# Patient Record
Sex: Male | Born: 1935 | Race: White | Hispanic: No | Marital: Married | Smoking: Never smoker
Health system: Southern US, Community
[De-identification: ages and names within clinical notes are randomized; demographics above are authoritative.]

## PROBLEM LIST (undated history)

## (undated) DIAGNOSIS — I10 Essential (primary) hypertension: Secondary | ICD-10-CM

## (undated) DIAGNOSIS — F0390 Unspecified dementia without behavioral disturbance: Secondary | ICD-10-CM

## (undated) DIAGNOSIS — C801 Malignant (primary) neoplasm, unspecified: Secondary | ICD-10-CM

## (undated) DIAGNOSIS — E7800 Pure hypercholesterolemia, unspecified: Secondary | ICD-10-CM

## (undated) DIAGNOSIS — D649 Anemia, unspecified: Secondary | ICD-10-CM

---

## 2001-03-24 ENCOUNTER — Encounter: Admission: RE | Admit: 2001-03-24 | Discharge: 2001-03-24 | Payer: Self-pay | Admitting: Internal Medicine

## 2001-03-24 ENCOUNTER — Encounter: Payer: Self-pay | Admitting: Internal Medicine

## 2001-04-24 ENCOUNTER — Encounter (INDEPENDENT_AMBULATORY_CARE_PROVIDER_SITE_OTHER): Payer: Self-pay | Admitting: Specialist

## 2001-04-24 ENCOUNTER — Other Ambulatory Visit: Admission: RE | Admit: 2001-04-24 | Discharge: 2001-04-24 | Payer: Self-pay | Admitting: Urology

## 2001-09-09 ENCOUNTER — Inpatient Hospital Stay (HOSPITAL_COMMUNITY): Admission: EM | Admit: 2001-09-09 | Discharge: 2001-09-10 | Payer: Self-pay | Admitting: Emergency Medicine

## 2001-09-19 ENCOUNTER — Ambulatory Visit (HOSPITAL_COMMUNITY): Admission: RE | Admit: 2001-09-19 | Discharge: 2001-09-19 | Payer: Self-pay | Admitting: Internal Medicine

## 2001-09-28 ENCOUNTER — Ambulatory Visit (HOSPITAL_COMMUNITY): Admission: RE | Admit: 2001-09-28 | Discharge: 2001-09-28 | Payer: Self-pay | Admitting: Cardiology

## 2002-08-23 ENCOUNTER — Ambulatory Visit: Admission: RE | Admit: 2002-08-23 | Discharge: 2002-11-21 | Payer: Self-pay | Admitting: Radiation Oncology

## 2002-12-10 ENCOUNTER — Ambulatory Visit: Admission: RE | Admit: 2002-12-10 | Discharge: 2002-12-10 | Payer: Self-pay | Admitting: Radiation Oncology

## 2003-06-17 ENCOUNTER — Ambulatory Visit: Admission: RE | Admit: 2003-06-17 | Discharge: 2003-06-17 | Payer: Self-pay | Admitting: Radiation Oncology

## 2003-12-10 ENCOUNTER — Ambulatory Visit: Admission: RE | Admit: 2003-12-10 | Discharge: 2003-12-10 | Payer: Self-pay | Admitting: Radiation Oncology

## 2007-07-13 ENCOUNTER — Emergency Department (HOSPITAL_COMMUNITY): Admission: EM | Admit: 2007-07-13 | Discharge: 2007-07-13 | Payer: Self-pay | Admitting: Emergency Medicine

## 2008-08-05 ENCOUNTER — Encounter: Admission: RE | Admit: 2008-08-05 | Discharge: 2008-08-05 | Payer: Self-pay | Admitting: Internal Medicine

## 2009-10-21 ENCOUNTER — Encounter: Admission: RE | Admit: 2009-10-21 | Discharge: 2009-10-21 | Payer: Self-pay | Admitting: Neurological Surgery

## 2010-12-04 NOTE — Discharge Summary (Signed)
[facility]  Patient:    Nathan Glenn, Nathan Glenn Visit Number: [id number] MRN: [id number]          Service Type: CAT Location: [facility] 2864 01 Attending Physician:  Norman Clay Dictated by:   Pearletha Furl Jacky Kindle, M.D. Admit Date:  09/28/2001 Discharge Date: 09/28/2001                             Discharge Summary  DISCHARGE DIAGNOSIS:  Left arm and chest pain.  Myocardial infarction ruled out.  HISTORY OF PRESENT ILLNESS:  Mr. Nathan Glenn is a 77 year old gentleman without past medical history presenting with 24 hours of left arm pain and questionable chest symptoms that waxed and waned, and seemingly were responsive to nitroglycerin in the emergency room.  Upon my evaluation, actually, symptoms had recurred and were quite atypical, but given emergency room physician suspicions and administration of nitroglycerin, he was admitted for 24 hours to rule out myocardial infarction.  For details, see the dictated summary on the chart.  He has had no prior cardiac history.  LABORATORY DATA:  EKG was normal.  CBC showed a hemoglobin of 13.5, hematocrit 38.5, white blood cell count 6.8, platelet count 208,000.  Chemistries showed sodium of 140, potassium 4.3, chloride 108, CO2 28, BUN 12, creatinine 1.0, calcium 9.3, glucose 97.  CKs were 53 and 49, respectively.  MBs were 0.7 and 0.9, respectively.  Third CK was 47, with an MB of 0.5.  Troponin-I was 0.01, with no indication of injury.  Repeat EKG was unremarkable.  Chest x-ray was done supposedly by the emergency room, and not present for review on the chart.  HOSPITAL COURSE:  The patient was admitted, observed for 24 hours.  Enzymes were unremarkable.  EKG was without change.  He had no further symptomatology. He was discharged in stable condition to be followed up as an outpatient by Dr. Waynard Edwards for further assessment regarding the need for any further cardiac testing or further workup.  CONDITION  ON DISCHARGE:  Improved and stable.  PROGNOSIS:  Good. Dictated by:   Pearletha Furl Jacky Kindle, M.D. Attending Physician:  Norman Clay DD:  10/27/01 TD:  10/28/01 Job: [id number]

## 2010-12-04 NOTE — H&P (Signed)
[facility]  Patient:    Nathan Glenn, Nathan Glenn Visit Number: [id number] MRN: [id number]          Service Type: MED Location: 3W 4854 02 Attending Physician:  Beatris Ship Dictated by:   Pearletha Furl Jacky Kindle, M.D. Admit Date:  09/09/2001                           History and Physical  CHIEF COMPLAINT:  Rule out MI/left arm pain.  HISTORY OF PRESENT ILLNESS:  Mr. Portlock is a 77 year old gentleman without significant past medical history, presenting with waxing and waning left arm symptoms.  He has no prior cardiac history but does have a history of cervical spine disease, status post cervical spine surgery several years ago.  He has had some chronic cervical spine pain but has never had any chest or arm symptoms.  He relates yesterday morning the onset of a fairly significant waxing and waning left arm pain which had lasted all yesterday and persisted through to this morning hours, resulting in presentation to the emergency room.  He has had no chest pain, shortness of breath, nausea, or vomiting, and has had no shortness of breath, cough, or sputum.  The pain is clearly nonpositional, related to the shoulder or neck.  Emergency room physician administered sublingual nitroglycerin with near total resolution of all symptoms, resulting in this admission for possible angina equivalent and rule out MI.  The patient has had no back symptoms and, at this time, is essentially pain-free.  ALLERGIES:  No known drug allergies.  MEDICATIONS:  Doxycycline 100 mg daily.  PAST MEDICAL HISTORY: 1. Rosacea. 2. Cervical spondylosis.  PAST SURGICAL HISTORY: 1. Appendectomy. 2. Cervical disk 1989. 3. Prostate biopsy and abscess.  SOCIAL HISTORY:  The patient is retired.  Does not smoke or drink.  He is married and has one child.  PHYSICAL EXAMINATION:  VITAL SIGNS:  Blood pressure 160/80, pulse 70, respiratory rate 18, temperature 96.9.  GENERAL:   The patient is pleasant.  In no distress.  HEENT:  Normocephalic, anicteric.  Good facial symmetry.  Oropharynx benign.  NECK:  Supple.  No JVD.  No bruits.  LUNGS:  Clear.  CARDIOVASCULAR:  Regular rate and rhythm.  No murmur.  CHEST:  Chest wall negative.  ABDOMEN:  Soft and nontender.  No hepatosplenomegaly.  Bowel sounds normal.  BACK:  Without CVA or spinal tenderness.  EXTREMITIES:  Trace edema.  Calves soft and nontender.  Intact pulses.  Joints normal.  Warm distally.  No mottling.  NEUROLOGIC:  Nonlateralizing.  Higher cortical functioning is intact. Nonfocal.  GU, RECTAL:  Not done.  LABORATORY DATA:  EKG:  Normal sinus rhythm.  No abnormalities.  ASSESSMENT:  Left arm symptoms:  Response to nitroglycerin suggestive of anginal equivalent, but clinical picture certainly consistent with cervical spine disease and radiculopathy as well.  Given the protracted nature of symptoms and normal ______ of the EKG, I think cardiac etiology of low likelihood; but, again, given nitroglycerin response prior to my arrival, cardiac etiology, albeit low, needs to be considered.  PLAN:  Patient to be admitted for serial enzymes, topical nitrates, serial EKGs.  Further pending his course.  We will also initiate empiric nonsteroidals. Dictated by:   Pearletha Furl Jacky Kindle, M.D. Attending Physician:  Beatris Ship DD:  09/09/01 TD:  09/10/01 Job: [id number]

## 2010-12-04 NOTE — Cardiovascular Report (Signed)
[place]  Patient:    Nathan Glenn, Nathan Glenn Visit Number: [id number] MRN: [id number]          Service Type: CAT Location: [facility] 2864 01 Attending Physician:  Norman Clay Dictated by:   Darden Palmer., M.D. Proc. Date: 09/28/01 Admit Date:  09/28/2001 Discharge Date: 09/28/2001   CC:         Cardiac Catheterization Lab  [facility]   Cardiac Catheterization  HISTORY:  A 77 year old white male who has a previous history of some atypical left arm pain as well as neck pain.  There is the possibility he may need to have cervical disk disease done.  He tolerated the procedure well without complications and following the procedure, he had to go to hemostasis present.  Intracoronary nitroglycerin was given down the right coronary artery because of possible proximal spasm which resolved with IC nitroglycerin.  He tolerated the procedure well.  HEMODYNAMIC DATA: 1. Aorta post contrast 97/52. 2. LV post contrast 97/0-7.  ANGIOGRAPHIC DATA:  LEFT VENTRICULOGRAM:  Performed in the 30-degree Nathan Glenn projection.  The aortic valve was normal.  The mitral valve was normal.  The left ventricle appears normal in size.  The estimated ejection fraction is 60-65%.  CORONARY ARTERIOGRAPHY:  Coronary arteries arise and distribute normally. There is calcification present involving the proximal left coronary system as well as the right coronary artery. 1. The left main coronary artery has a mild 20-30% ostial narrowing.  2. The left anterior descending has mild 20-30% proximal irregularity.  The    first diagonal branch has a segmental 80% ostial stenosis noted.  3. The circumflex marginal branch has a large branching marginal system.  A    small intermedius branch arises.  The inferior branch of the first marginal    branch has a segmental 50-70% ostial narrowing noted.  The second marginal    artery is free of  disease.  4. The right coronary artery contains no significant proximal disease after IC    nitroglycerin.  There is a segmental area of distal disease estimated at    70-80% in the mid to distal portion involving the origin of the posterior    descending artery.  IMPRESSION: 1. Coronary artery disease, predominantly involving the diagonal branch of the    left anterior descending artery and inferior branch of the first marginal    branch and the mid to distal right coronary artery which is moderate to    moderately severe. 2. Normal left ventricular function.  RECOMMENDATIONS:  Continue medical therapy at this time.  Add beta blockers to therapy, intensive lipid treatment.  Search for other causes of arm pain. Dictated by:   Darden Palmer., M.D. Attending Physician:  Norman Clay DD:  09/28/01 TD:  09/29/01 Job: [id number]

## 2011-07-09 ENCOUNTER — Other Ambulatory Visit: Payer: Self-pay | Admitting: Cardiology

## 2011-07-26 ENCOUNTER — Other Ambulatory Visit: Payer: Self-pay | Admitting: Neurological Surgery

## 2011-07-26 DIAGNOSIS — M542 Cervicalgia: Secondary | ICD-10-CM

## 2011-08-10 ENCOUNTER — Ambulatory Visit
Admission: RE | Admit: 2011-08-10 | Discharge: 2011-08-10 | Disposition: A | Discharge status: Home / Self Care | Payer: Medicare Other | Source: Ambulatory Visit | Attending: Neurological Surgery | Admitting: Neurological Surgery

## 2011-08-10 DIAGNOSIS — M542 Cervicalgia: Secondary | ICD-10-CM

## 2013-10-09 ENCOUNTER — Emergency Department (HOSPITAL_COMMUNITY): Payer: Medicare Other

## 2013-10-09 ENCOUNTER — Emergency Department (HOSPITAL_COMMUNITY)
Admission: EM | Admit: 2013-10-09 | Discharge: 2013-10-09 | Disposition: A | Discharge status: Home / Self Care | Payer: Medicare Other | Attending: Emergency Medicine | Admitting: Emergency Medicine

## 2013-10-09 ENCOUNTER — Encounter (HOSPITAL_COMMUNITY): Payer: Self-pay | Admitting: Emergency Medicine

## 2013-10-09 DIAGNOSIS — F0280 Dementia in other diseases classified elsewhere without behavioral disturbance: Secondary | ICD-10-CM | POA: Insufficient documentation

## 2013-10-09 DIAGNOSIS — D649 Anemia, unspecified: Secondary | ICD-10-CM

## 2013-10-09 DIAGNOSIS — Z79899 Other long term (current) drug therapy: Secondary | ICD-10-CM | POA: Insufficient documentation

## 2013-10-09 DIAGNOSIS — G309 Alzheimer's disease, unspecified: Secondary | ICD-10-CM | POA: Insufficient documentation

## 2013-10-09 DIAGNOSIS — R531 Weakness: Secondary | ICD-10-CM

## 2013-10-09 DIAGNOSIS — R5383 Other fatigue: Principal | ICD-10-CM

## 2013-10-09 DIAGNOSIS — R5381 Other malaise: Secondary | ICD-10-CM | POA: Insufficient documentation

## 2013-10-09 HISTORY — DX: Unspecified dementia, unspecified severity, without behavioral disturbance, psychotic disturbance, mood disturbance, and anxiety: F03.90

## 2013-10-09 LAB — CBC WITH DIFFERENTIAL/PLATELET
BASOS ABS: 0.1 10*3/uL (ref 0.0–0.1)
BASOS PCT: 0 % (ref 0–1)
EOS ABS: 0.2 10*3/uL (ref 0.0–0.7)
Eosinophils Relative: 2 % (ref 0–5)
HCT: 26.5 % — ABNORMAL LOW (ref 39.0–52.0)
Hemoglobin: 8.3 g/dL — ABNORMAL LOW (ref 13.0–17.0)
Lymphocytes Relative: 13 % (ref 12–46)
Lymphs Abs: 1.8 10*3/uL (ref 0.7–4.0)
MCH: 26.1 pg (ref 26.0–34.0)
MCHC: 31.3 g/dL (ref 30.0–36.0)
MCV: 83.3 fL (ref 78.0–100.0)
MONOS PCT: 12 % (ref 3–12)
Monocytes Absolute: 1.6 10*3/uL — ABNORMAL HIGH (ref 0.1–1.0)
NEUTROS ABS: 10.3 10*3/uL — AB (ref 1.7–7.7)
NEUTROS PCT: 74 % (ref 43–77)
PLATELETS: 532 10*3/uL — AB (ref 150–400)
RBC: 3.18 MIL/uL — ABNORMAL LOW (ref 4.22–5.81)
RDW: 13.4 % (ref 11.5–15.5)
WBC: 14.1 10*3/uL — ABNORMAL HIGH (ref 4.0–10.5)

## 2013-10-09 LAB — URINALYSIS, ROUTINE W REFLEX MICROSCOPIC
Bilirubin Urine: NEGATIVE
GLUCOSE, UA: NEGATIVE mg/dL
HGB URINE DIPSTICK: NEGATIVE
Ketones, ur: NEGATIVE mg/dL
LEUKOCYTES UA: NEGATIVE
Nitrite: NEGATIVE
PH: 7 (ref 5.0–8.0)
PROTEIN: NEGATIVE mg/dL
SPECIFIC GRAVITY, URINE: 1.016 (ref 1.005–1.030)
Urobilinogen, UA: 1 mg/dL (ref 0.0–1.0)

## 2013-10-09 LAB — COMPREHENSIVE METABOLIC PANEL
ALT: 15 U/L (ref 0–53)
AST: 19 U/L (ref 0–37)
Albumin: 2.7 g/dL — ABNORMAL LOW (ref 3.5–5.2)
Alkaline Phosphatase: 154 U/L — ABNORMAL HIGH (ref 39–117)
BILIRUBIN TOTAL: 0.3 mg/dL (ref 0.3–1.2)
BUN: 29 mg/dL — AB (ref 6–23)
CHLORIDE: 97 meq/L (ref 96–112)
CO2: 25 mEq/L (ref 19–32)
Calcium: 9.7 mg/dL (ref 8.4–10.5)
Creatinine, Ser: 1.28 mg/dL (ref 0.50–1.35)
GFR calc Af Amer: 56 mL/min — ABNORMAL LOW (ref >90)
GFR calc non Af Amer: 49 mL/min — ABNORMAL LOW (ref >90)
Glucose, Bld: 111 mg/dL — ABNORMAL HIGH (ref 70–99)
Potassium: 3.5 mEq/L — ABNORMAL LOW (ref 3.7–5.3)
SODIUM: 138 meq/L (ref 137–147)
TOTAL PROTEIN: 7.2 g/dL (ref 6.0–8.3)

## 2013-10-09 LAB — PROTIME-INR
INR: 1 (ref 0.00–1.49)
Prothrombin Time: 13 seconds (ref 11.6–15.2)

## 2013-10-09 LAB — LACTIC ACID, PLASMA: LACTIC ACID, VENOUS: 0.2 mmol/L — AB (ref 0.5–2.2)

## 2013-10-09 MED ORDER — SODIUM CHLORIDE 0.9 % IV SOLN
1000.0000 mL | INTRAVENOUS | Status: DC
  Administered 2013-10-09: 1000 mL via INTRAVENOUS

## 2013-10-09 MED ORDER — SODIUM CHLORIDE 0.9 % IV SOLN
1000.0000 mL | Freq: Once | INTRAVENOUS | Status: AC
  Administered 2013-10-09: 1000 mL via INTRAVENOUS

## 2013-10-09 NOTE — Progress Notes (Signed)
   CARE MANAGEMENT ED NOTE 10/09/2013  Patient:  Nathan Glenn, Nathan Glenn   Account Number:  [id number]  Date Initiated:  10/09/2013  Documentation initiated by:  Livia Snellen  Subjective/Objective Assessment:   Patient presents to Ed with increased weakness.     Subjective/Objective Assessment Detail:   Patient with history of dementia.     Action/Plan:   Action/Plan Detail:   Anticipated DC Date:  10/09/2013     Status Recommendation to Physician:   Result of Recommendation:    Other ED [place]  Other  PCP issues    Choice offered to / List presented to:  C-3 Spouse          Status of service:  Completed, signed off  ED Comments:   ED Comments Detail:  EDCM spoke to patient and family at bedside.  Patient lives at home with hhis wife.  Patient currently does not have home health services at this time.  EDCM provided patient's wife a list of home health agencies in [facility] and a list of private duty nursing services.  EDCM explained with home health, patient may receive a visting RN, PT, OT and social worker if needed.  As per patient's family member, "I don't think he really needs those kind of services right now.  I mean, he's ok, he's just been so tired and weak lately."  EDCM explained to patient's family that if they feel that he needs home health services in the future, they can ask the patient's pcp to order these services.  EDCM also explained that the private duty nursing services would be an out of pocket expense. Patient and paient's family thankful for resources.  no further EDCM needs at this time.

## 2013-10-09 NOTE — Progress Notes (Signed)
Patient's wife confirmed patient's pcp is Dr. Haynes Kerns on [address] updated.

## 2013-10-09 NOTE — ED Notes (Addendum)
Pts family reports that "has not been acting like himself lately" and "he has been weak and tired lately." Pt says he feels fine. Pt able to stand and get into bed. Pt in NAD. Family says pt has dementia and lives at home and is unable to take care of his adl's for the past month. Family reports a fall 3-4 weeks ago and he did not c/o of anything at that time.

## 2013-10-09 NOTE — Discharge Instructions (Signed)
As discussed, it is very important that you follow-up with your physician tomorrow via telephone to establish an appointment for additional testing.  Please return here for any concerning changes in his condition.   Anemia, Nonspecific Anemia is a condition in which the concentration of red blood cells or hemoglobin in the blood is below normal. Hemoglobin is a substance in red blood cells that carries oxygen to the tissues of the body. Anemia results in not enough oxygen reaching these tissues.  CAUSES  Common causes of anemia include:   Excessive bleeding. Bleeding may be internal or external. This includes excessive bleeding from periods (in women) or from the intestine.   Poor nutrition.   Chronic kidney, thyroid, and liver disease.  Bone marrow disorders that decrease red blood cell production.  Cancer and treatments for cancer.  HIV, AIDS, and their treatments.  Spleen problems that increase red blood cell destruction.  Blood disorders.  Excess destruction of red blood cells due to infection, medicines, and autoimmune disorders. SIGNS AND SYMPTOMS   Minor weakness.   Dizziness.   Headache.  Palpitations.   Shortness of breath, especially with exercise.   Paleness.  Cold sensitivity.  Indigestion.  Nausea.  Difficulty sleeping.  Difficulty concentrating. Symptoms may occur suddenly or they may develop slowly.  DIAGNOSIS  Additional blood tests are often needed. These help your health care provider determine the best treatment. Your health care provider will check your stool for blood and look for other causes of blood loss.  TREATMENT  Treatment varies depending on the cause of the anemia. Treatment can include:   Supplements of iron, vitamin N79, or folic acid.   Hormone medicines.   A blood transfusion. This may be needed if blood loss is severe.   Hospitalization. This may be needed if there is significant continual blood loss.    Dietary changes.  Spleen removal. HOME CARE INSTRUCTIONS Keep all follow-up appointments. It often takes many weeks to correct anemia, and having your health care provider check on your condition and your response to treatment is very important. SEEK IMMEDIATE MEDICAL CARE IF:   You develop extreme weakness, shortness of breath, or chest pain.   You become dizzy or have trouble concentrating.  You develop heavy vaginal bleeding.   You develop a rash.   You have bloody or black, tarry stools.   You faint.   You vomit up blood.   You vomit repeatedly.   You have abdominal pain.  You have a fever or persistent symptoms for more than 2 3 days.   You have a fever and your symptoms suddenly get worse.   You are dehydrated.  MAKE SURE YOU:  Understand these instructions.  Will watch your condition.  Will get help right away if you are not doing well or get worse. Document Released: 08/12/2004 Document Revised: 03/07/2013 Document Reviewed: 12/29/2012 [facility] Patient Information 2014 [place].

## 2013-10-09 NOTE — ED Provider Notes (Signed)
CSN: [id number]     Arrival date & time 10/09/13  1601 History   First MD Initiated Contact with Patient 10/09/13 1623     Chief Complaint  Patient presents with  . Weakness     (Consider location/radiation/quality/duration/timing/severity/associated sxs/prior Treatment) HPI Level V caveat, dementia. Patient presents with family members relate his history. Patient has a long history of dementia. Chemistries the past 2 months patient has had progressive decline in her functional status, including increasing anorexia, somnolence, disturbed sleep patterns and frequent falls. They report no new cough, fever, vomiting, diarrhea. The patient himself denies pain, nausea, lightheadedness.  He is oriented to self and family members, but not to time, and only partially to place.  Past Medical History  Diagnosis Date  . Dementia    History reviewed. No pertinent past surgical history. No family history on file. History  Substance Use Topics  . Smoking status: Never Smoker   . Smokeless tobacco: Not on file  . Alcohol Use: No    Review of Systems  Unable to perform ROS: Dementia      Allergies  Review of patient's allergies indicates no known allergies.  Home Medications   Current Outpatient Rx  Name  Route  Sig  Dispense  Refill  . atorvastatin (LIPITOR) 10 MG tablet   Oral   Take 10 mg by mouth daily.         . cholecalciferol (VITAMIN D) 400 UNITS TABS tablet   Oral   Take 400 Units by mouth daily.         Marland Kitchen galantamine (RAZADYNE ER) 16 MG 24 hr capsule   Oral   Take 16 mg by mouth every evening.          . Omega-3 Fatty Acids (FISH OIL) 1000 MG CAPS   Oral   Take 1 capsule by mouth daily.         Marland Kitchen triamterene-hydrochlorothiazide (MAXZIDE-25) 37.5-25 MG per tablet   Oral   Take 1 tablet by mouth daily.          BP 152/57  Pulse 81  Temp(Src) 97.8 F (36.6 C) (Oral)  Resp 18  SpO2 100% Physical Exam  Nursing note and vitals  reviewed. Constitutional: He is oriented to person, place, and time. He appears well-developed. No distress.  HENT:  Head: Normocephalic and atraumatic.  Eyes: Conjunctivae and EOM are normal.  Cardiovascular: Normal rate and regular rhythm.   Pulmonary/Chest: Effort normal. No stridor. No respiratory distress.  Abdominal: He exhibits no distension.  Musculoskeletal: He exhibits no edema.  Neurological: He is alert and oriented to person, place, and time. No cranial nerve deficit. Coordination normal.  Skin: Skin is warm and dry. No rash noted. No erythema.  Psychiatric: His speech is delayed. He is slowed. Cognition and memory are impaired.    ED Course  Procedures (including critical care time) Labs Review Labs Reviewed  CBC WITH DIFFERENTIAL - Abnormal; Notable for the following:    WBC 14.1 (*)    RBC 3.18 (*)    Hemoglobin 8.3 (*)    HCT 26.5 (*)    Platelets 532 (*)    Neutro Abs 10.3 (*)    Monocytes Absolute 1.6 (*)    All other components within normal limits  COMPREHENSIVE METABOLIC PANEL - Abnormal; Notable for the following:    Potassium 3.5 (*)    Glucose, Bld 111 (*)    BUN 29 (*)    Albumin 2.7 (*)    Alkaline Phosphatase  154 (*)    GFR calc non Af Amer 49 (*)    GFR calc Af Amer 56 (*)    All other components within normal limits  LACTIC ACID, PLASMA - Abnormal; Notable for the following:    Lactic Acid, Venous 0.2 (*)    All other components within normal limits  PROTIME-INR  URINALYSIS, ROUTINE W REFLEX MICROSCOPIC   Imaging Review Dg Chest 2 View  10/09/2013   CLINICAL DATA:  Fatigue, weakness, chest congestion  EXAM: CHEST  2 VIEW  COMPARISON:  None.  FINDINGS: The mediastinal contour is normal. The heart size is upper limits of normal to mildly enlarged. The aorta is tortuous. There is minimal increased pulmonary interstitium of both lungs. Calcified granuloma is identified in the right upper lobe. No acute abnormalities identified within the  visualized bones.  IMPRESSION: Mild increased pulmonary interstitium which can be seen in mild interstitial edema versus viral infection.   Electronically Signed   By: Abelardo Diesel M.D.   On: 10/09/2013 17:22   Ct Head Wo Contrast  10/09/2013   CLINICAL DATA:  Weakness.  EXAM: CT HEAD WITHOUT CONTRAST  TECHNIQUE: Contiguous axial images were obtained from the base of the skull through the vertex without intravenous contrast.  COMPARISON:  None.  FINDINGS: There is moderate cerebral atrophy. There is no evidence of acute cortical infarct, mass, midline shift, intracranial hemorrhage, or extra-axial fluid collection. Periventricular white-matter hypodensities are nonspecific but compatible with mild chronic small vessel ischemic disease. Prior bilateral cataract surgery is noted. The visualized paranasal sinuses and mastoid air cells are clear.  IMPRESSION: No evidence of acute intracranial abnormality.   Electronically Signed   By: Logan Bores   On: 10/09/2013 17:24   ECG : SR, PAC, 76, st wave changes that are non-specific  7:43 PM On exam the patient appears comfortable, is in no distress, hemodynamically stable. I had a lengthy conversation with patient's wife and son about all results, including anemia, absence of notable findings on CT or x-ray. With the chronicity of his complaints, he will be discharged to follow up with primary care for further evaluation of the anemia. In addition, I discussed home health assessment with the patient's wife, and she was amenable to this resource which was provided for her and her husband.  MDM   Final diagnoses:  Anemia  Weakness    Patient with Alzheimer's dementia presents with familial concerns of progressive decline.  Patient is in no distress throughout his emergency department course, hemodynamically stable, denying any ongoing complaints.  Evaluation here demonstrates anemia.  With no distress, no hemodynamically instability, and discharged to  followup with his primary care physician.    Carmin Muskrat, MD 10/09/13 1944

## 2013-10-12 ENCOUNTER — Ambulatory Visit (HOSPITAL_COMMUNITY)
Admission: RE | Admit: 2013-10-12 | Discharge: 2013-10-12 | Disposition: A | Discharge status: Home / Self Care | Payer: Medicare Other | Source: Ambulatory Visit | Attending: Internal Medicine | Admitting: Internal Medicine

## 2013-10-12 DIAGNOSIS — D649 Anemia, unspecified: Secondary | ICD-10-CM | POA: Insufficient documentation

## 2013-10-12 LAB — PREPARE RBC (CROSSMATCH)

## 2013-10-12 LAB — ABO/RH: ABO/RH(D): O POS

## 2013-10-14 ENCOUNTER — Ambulatory Visit (HOSPITAL_COMMUNITY): Payer: Medicare Other

## 2013-10-14 ENCOUNTER — Ambulatory Visit (HOSPITAL_COMMUNITY): Admission: RE | Admit: 2013-10-14 | Payer: Self-pay | Source: Ambulatory Visit | Admitting: Internal Medicine

## 2013-10-14 ENCOUNTER — Ambulatory Visit (HOSPITAL_COMMUNITY): Admission: RE | Admit: 2013-10-14 | Payer: Medicare Other | Source: Ambulatory Visit | Admitting: Internal Medicine

## 2013-10-15 ENCOUNTER — Encounter (HOSPITAL_COMMUNITY): Payer: Self-pay

## 2013-10-15 ENCOUNTER — Encounter (HOSPITAL_COMMUNITY)
Admission: RE | Admit: 2013-10-15 | Discharge: 2013-10-15 | Disposition: A | Discharge status: Home / Self Care | Payer: Medicare Other | Source: Ambulatory Visit | Attending: Internal Medicine | Admitting: Internal Medicine

## 2013-10-15 ENCOUNTER — Other Ambulatory Visit (HOSPITAL_COMMUNITY): Payer: Self-pay | Admitting: Internal Medicine

## 2013-10-15 DIAGNOSIS — D649 Anemia, unspecified: Secondary | ICD-10-CM | POA: Insufficient documentation

## 2013-10-15 HISTORY — DX: Malignant (primary) neoplasm, unspecified: C80.1

## 2013-10-15 HISTORY — DX: Essential (primary) hypertension: I10

## 2013-10-15 HISTORY — DX: Pure hypercholesterolemia, unspecified: E78.00

## 2013-10-15 HISTORY — DX: Anemia, unspecified: D64.9

## 2013-10-15 LAB — PREPARE RBC (CROSSMATCH)

## 2013-10-15 MED ORDER — ACETAMINOPHEN 325 MG PO TABS
650.0000 mg | ORAL_TABLET | Freq: Two times a day (BID) | ORAL | Status: AC
  Administered 2013-10-15 (×2): 650 mg via ORAL
  Filled 2013-10-15 (×2): qty 2

## 2013-10-15 MED ORDER — SODIUM CHLORIDE 0.9 % IV SOLN
Freq: Once | INTRAVENOUS | Status: AC
  Administered 2013-10-15: 09:00:00 via INTRAVENOUS

## 2013-10-15 NOTE — Discharge Instructions (Signed)
Blood Transfusion Information WHAT IS A BLOOD TRANSFUSION? A transfusion is the replacement of blood or some of its parts. Blood is made up of multiple cells which provide different functions.  Red blood cells carry oxygen and are used for blood loss replacement.  White blood cells fight against infection.  Platelets control bleeding.  Plasma helps clot blood.  Other blood products are available for specialized needs, such as hemophilia or other clotting disorders. BEFORE THE TRANSFUSION  Who gives blood for transfusions?   You may be able to donate blood to be used at a later date on yourself (autologous donation).  Relatives can be asked to donate blood. This is generally not any safer than if you have received blood from a stranger. The same precautions are taken to ensure safety when a relative's blood is donated.  Healthy volunteers who are fully evaluated to make sure their blood is safe. This is blood bank blood. Transfusion therapy is the safest it has ever been in the practice of medicine. Before blood is taken from a donor, a complete history is taken to make sure that person has no history of diseases nor engages in risky social behavior (examples are intravenous drug use or sexual activity with multiple partners). The donor's travel history is screened to minimize risk of transmitting infections, such as malaria. The donated blood is tested for signs of infectious diseases, such as HIV and hepatitis. The blood is then tested to be sure it is compatible with you in order to minimize the chance of a transfusion reaction. If you or a relative donates blood, this is often done in anticipation of surgery and is not appropriate for emergency situations. It takes many days to process the donated blood. RISKS AND COMPLICATIONS Although transfusion therapy is very safe and saves many lives, the main dangers of transfusion include:   Getting an infectious disease.  Developing a  transfusion reaction. This is an allergic reaction to something in the blood you were given. Every precaution is taken to prevent this. The decision to have a blood transfusion has been considered carefully by your caregiver before blood is given. Blood is not given unless the benefits outweigh the risks. AFTER THE TRANSFUSION  Right after receiving a blood transfusion, you will usually feel much better and more energetic. This is especially true if your red blood cells have gotten low (anemic). The transfusion raises the level of the red blood cells which carry oxygen, and this usually causes an energy increase.  The nurse administering the transfusion will monitor you carefully for complications. HOME CARE INSTRUCTIONS  No special instructions are needed after a transfusion. You may find your energy is better. Speak with your caregiver about any limitations on activity for underlying diseases you may have. SEEK MEDICAL CARE IF:   Your condition is not improving after your transfusion.  You develop redness or irritation at the intravenous (IV) site. SEEK IMMEDIATE MEDICAL CARE IF:  Any of the following symptoms occur over the next 12 hours:  Shaking chills.  You have a temperature by mouth above 102 F (38.9 C), not controlled by medicine.  Chest, back, or muscle pain.  People around you feel you are not acting correctly or are confused.  Shortness of breath or difficulty breathing.  Dizziness and fainting.  You get a rash or develop hives.  You have a decrease in urine output.  Your urine turns a dark color or changes to pink, red, or brown. Any of the following   symptoms occur over the next 10 days:  You have a temperature by mouth above 102 F (38.9 C), not controlled by medicine.  Shortness of breath.  Weakness after normal activity.  The white part of the eye turns yellow (jaundice).  You have a decrease in the amount of urine or are urinating less often.  Your  urine turns a dark color or changes to pink, red, or brown. Document Released: 07/02/2000 Document Revised: 09/27/2011 Document Reviewed: 02/19/2008 [employer] Patient Information 2014 [employer].  

## 2013-10-16 LAB — TYPE AND SCREEN
ABO/RH(D): O POS
ANTIBODY SCREEN: NEGATIVE
UNIT DIVISION: 0
Unit division: 0

## 2013-11-06 ENCOUNTER — Other Ambulatory Visit: Payer: Self-pay | Admitting: Internal Medicine

## 2013-11-06 DIAGNOSIS — D500 Iron deficiency anemia secondary to blood loss (chronic): Secondary | ICD-10-CM

## 2013-11-08 ENCOUNTER — Other Ambulatory Visit: Payer: Medicare Other

## 2013-12-14 ENCOUNTER — Encounter (HOSPITAL_COMMUNITY)
Admission: RE | Admit: 2013-12-14 | Discharge: 2013-12-14 | Disposition: A | Discharge status: Home / Self Care | Payer: Medicare Other | Source: Ambulatory Visit | Attending: Internal Medicine | Admitting: Internal Medicine

## 2013-12-14 ENCOUNTER — Encounter (HOSPITAL_COMMUNITY): Payer: Self-pay

## 2013-12-14 ENCOUNTER — Other Ambulatory Visit (HOSPITAL_COMMUNITY): Payer: Self-pay | Admitting: Internal Medicine

## 2013-12-14 DIAGNOSIS — D649 Anemia, unspecified: Secondary | ICD-10-CM | POA: Insufficient documentation

## 2013-12-14 MED ORDER — SODIUM CHLORIDE 0.9 % IV SOLN
1020.0000 mg | Freq: Once | INTRAVENOUS | Status: AC
  Administered 2013-12-14: 1020 mg via INTRAVENOUS
  Filled 2013-12-14: qty 34

## 2013-12-14 MED ORDER — SODIUM CHLORIDE 0.9 % IV SOLN
Freq: Once | INTRAVENOUS | Status: AC
  Administered 2013-12-14: 15:00:00 via INTRAVENOUS

## 2013-12-14 NOTE — Discharge Instructions (Signed)

## 2014-02-01 ENCOUNTER — Inpatient Hospital Stay (HOSPITAL_COMMUNITY)
Admission: EM | Admit: 2014-02-01 | Discharge: 2014-02-06 | DRG: 374 | Disposition: A | Discharge status: Hospice | Payer: Medicare Other | Attending: Internal Medicine | Admitting: Internal Medicine

## 2014-02-01 ENCOUNTER — Emergency Department (HOSPITAL_COMMUNITY): Payer: Medicare Other

## 2014-02-01 ENCOUNTER — Encounter (HOSPITAL_COMMUNITY): Payer: Self-pay | Admitting: Emergency Medicine

## 2014-02-01 DIAGNOSIS — D72829 Elevated white blood cell count, unspecified: Secondary | ICD-10-CM | POA: Diagnosis present

## 2014-02-01 DIAGNOSIS — C787 Secondary malignant neoplasm of liver and intrahepatic bile duct: Secondary | ICD-10-CM | POA: Diagnosis present

## 2014-02-01 DIAGNOSIS — R112 Nausea with vomiting, unspecified: Secondary | ICD-10-CM | POA: Diagnosis present

## 2014-02-01 DIAGNOSIS — I10 Essential (primary) hypertension: Secondary | ICD-10-CM | POA: Diagnosis present

## 2014-02-01 DIAGNOSIS — R160 Hepatomegaly, not elsewhere classified: Secondary | ICD-10-CM

## 2014-02-01 DIAGNOSIS — Z85828 Personal history of other malignant neoplasm of skin: Secondary | ICD-10-CM

## 2014-02-01 DIAGNOSIS — Z79899 Other long term (current) drug therapy: Secondary | ICD-10-CM

## 2014-02-01 DIAGNOSIS — K921 Melena: Secondary | ICD-10-CM | POA: Diagnosis present

## 2014-02-01 DIAGNOSIS — D473 Essential (hemorrhagic) thrombocythemia: Secondary | ICD-10-CM | POA: Diagnosis present

## 2014-02-01 DIAGNOSIS — Z8546 Personal history of malignant neoplasm of prostate: Secondary | ICD-10-CM

## 2014-02-01 DIAGNOSIS — R195 Other fecal abnormalities: Secondary | ICD-10-CM | POA: Diagnosis present

## 2014-02-01 DIAGNOSIS — K56609 Unspecified intestinal obstruction, unspecified as to partial versus complete obstruction: Secondary | ICD-10-CM | POA: Diagnosis present

## 2014-02-01 DIAGNOSIS — D649 Anemia, unspecified: Secondary | ICD-10-CM | POA: Diagnosis present

## 2014-02-01 DIAGNOSIS — K631 Perforation of intestine (nontraumatic): Secondary | ICD-10-CM | POA: Diagnosis present

## 2014-02-01 DIAGNOSIS — F0390 Unspecified dementia without behavioral disturbance: Secondary | ICD-10-CM | POA: Diagnosis present

## 2014-02-01 DIAGNOSIS — D75839 Thrombocytosis, unspecified: Secondary | ICD-10-CM | POA: Diagnosis present

## 2014-02-01 DIAGNOSIS — K5669 Other intestinal obstruction: Secondary | ICD-10-CM | POA: Diagnosis present

## 2014-02-01 DIAGNOSIS — Z66 Do not resuscitate: Secondary | ICD-10-CM | POA: Diagnosis not present

## 2014-02-01 DIAGNOSIS — E7800 Pure hypercholesterolemia, unspecified: Secondary | ICD-10-CM | POA: Diagnosis present

## 2014-02-01 DIAGNOSIS — C182 Malignant neoplasm of ascending colon: Secondary | ICD-10-CM | POA: Diagnosis not present

## 2014-02-01 DIAGNOSIS — K5660 Unspecified intestinal obstruction: Secondary | ICD-10-CM

## 2014-02-01 DIAGNOSIS — E785 Hyperlipidemia, unspecified: Secondary | ICD-10-CM | POA: Diagnosis present

## 2014-02-01 DIAGNOSIS — Z515 Encounter for palliative care: Secondary | ICD-10-CM

## 2014-02-01 LAB — COMPREHENSIVE METABOLIC PANEL
ALBUMIN: 2.4 g/dL — AB (ref 3.5–5.2)
ALK PHOS: 242 U/L — AB (ref 39–117)
ALT: 20 U/L (ref 0–53)
AST: 27 U/L (ref 0–37)
Anion gap: 17 — ABNORMAL HIGH (ref 5–15)
BILIRUBIN TOTAL: 0.4 mg/dL (ref 0.3–1.2)
BUN: 22 mg/dL (ref 6–23)
CHLORIDE: 95 meq/L — AB (ref 96–112)
CO2: 23 mEq/L (ref 19–32)
Calcium: 9.6 mg/dL (ref 8.4–10.5)
Creatinine, Ser: 1.08 mg/dL (ref 0.50–1.35)
GFR calc non Af Amer: 59 mL/min — ABNORMAL LOW (ref >90)
GFR, EST AFRICAN AMERICAN: 69 mL/min — AB (ref >90)
GLUCOSE: 122 mg/dL — AB (ref 70–99)
POTASSIUM: 4.3 meq/L (ref 3.7–5.3)
Sodium: 135 mEq/L — ABNORMAL LOW (ref 137–147)
Total Protein: 7.2 g/dL (ref 6.0–8.3)

## 2014-02-01 LAB — CBC WITH DIFFERENTIAL/PLATELET
Basophils Absolute: 0 10*3/uL (ref 0.0–0.1)
Basophils Relative: 0 % (ref 0–1)
Eosinophils Absolute: 0 10*3/uL (ref 0.0–0.7)
Eosinophils Relative: 0 % (ref 0–5)
HCT: 35.5 % — ABNORMAL LOW (ref 39.0–52.0)
HEMOGLOBIN: 11.6 g/dL — AB (ref 13.0–17.0)
Lymphocytes Relative: 8 % — ABNORMAL LOW (ref 12–46)
Lymphs Abs: 1.6 10*3/uL (ref 0.7–4.0)
MCH: 28 pg (ref 26.0–34.0)
MCHC: 32.7 g/dL (ref 30.0–36.0)
MCV: 85.7 fL (ref 78.0–100.0)
MONOS PCT: 8 % (ref 3–12)
Monocytes Absolute: 1.6 10*3/uL — ABNORMAL HIGH (ref 0.1–1.0)
NEUTROS ABS: 16.8 10*3/uL — AB (ref 1.7–7.7)
Neutrophils Relative %: 84 % — ABNORMAL HIGH (ref 43–77)
Platelets: 503 10*3/uL — ABNORMAL HIGH (ref 150–400)
RBC: 4.14 MIL/uL — AB (ref 4.22–5.81)
RDW: 15.2 % (ref 11.5–15.5)
WBC: 20.1 10*3/uL — ABNORMAL HIGH (ref 4.0–10.5)

## 2014-02-01 LAB — POC OCCULT BLOOD, ED: Fecal Occult Bld: POSITIVE — AB

## 2014-02-01 MED ORDER — IOHEXOL 300 MG/ML  SOLN
50.0000 mL | Freq: Once | INTRAMUSCULAR | Status: AC | PRN
  Administered 2014-02-01: 50 mL via ORAL

## 2014-02-01 MED ORDER — SODIUM CHLORIDE 0.9 % IV BOLUS (SEPSIS)
1000.0000 mL | Freq: Once | INTRAVENOUS | Status: AC
  Administered 2014-02-01: 1000 mL via INTRAVENOUS

## 2014-02-01 MED ORDER — SODIUM CHLORIDE 0.9 % IV BOLUS (SEPSIS)
500.0000 mL | Freq: Once | INTRAVENOUS | Status: AC
  Administered 2014-02-01: 500 mL via INTRAVENOUS

## 2014-02-01 NOTE — ED Notes (Signed)
EMS called to home.  Family called 911 due to patient having 4 episodes of vomiting.   Family also states that the patient also has been having tarry stools x3 months.  Patient  Not showing any of distress on EMS arrival.  Patient denies any pain or nausea at this time.

## 2014-02-01 NOTE — ED Provider Notes (Signed)
CSN: [id number]     Arrival date & time 02/01/14  2205 History   First MD Initiated Contact with Patient 02/01/14 2306     Chief Complaint  Patient presents with  . Rectal Bleeding  . Emesis     (Consider location/radiation/quality/duration/timing/severity/associated sxs/prior Treatment) HPI Comments: The patient is 80 year old male past medical history of dementia, hypertension, anemia, skin and prostate cancer BIB EMS to the emergency department with a chief complaint of 4 episodes vomiting today. The patient is a poor historian, history is provided by EMS personnel. Per EMS the patient's family reports melanotic stool for 3 months.  The patient denies abdominal pain, reports one episode of vomiting but is unable to state when. Level V caveat, dementia.  Patient is a 80 y.o. male presenting with hematochezia and vomiting. The history is provided by the patient. No language interpreter was used.  Rectal Bleeding Associated symptoms: vomiting   Emesis   Past Medical History  Diagnosis Date  . Dementia   . Hypertension   . High cholesterol   . Anemia   . Cancer     skin cancer, prostate cancer   History reviewed. No pertinent past surgical history. History reviewed. No pertinent family history. History  Substance Use Topics  . Smoking status: Never Smoker   . Smokeless tobacco: Not on file  . Alcohol Use: No    Review of Systems  Unable to perform ROS: Dementia  Gastrointestinal: Positive for vomiting and hematochezia.      Allergies  Review of patient's allergies indicates no known allergies.  Home Medications   Prior to Admission medications   Medication Sig Start Date End Date Taking? Authorizing Provider  atorvastatin (LIPITOR) 10 MG tablet Take 10 mg by mouth daily.   Yes Historical Provider, MD  cholecalciferol (VITAMIN D) 400 UNITS TABS tablet Take 400 Units by mouth daily.   Yes Historical Provider, MD  ferrous sulfate 325 (65 FE) MG tablet Take 325 mg by  mouth 2 (two) times daily with a meal.   Yes Historical Provider, MD  galantamine (RAZADYNE ER) 16 MG 24 hr capsule Take 16 mg by mouth every evening.    Yes Historical Provider, MD  metoprolol succinate (TOPROL-XL) 50 MG 24 hr tablet Take 25 mg by mouth daily. Take with or immediately following a meal.  Pt takes toprol, unsure of milligrams, takes 1/2 tablet once a day   Yes Historical Provider, MD  Omega-3 Fatty Acids (FISH OIL) 1000 MG CAPS Take 1 capsule by mouth daily.   Yes Historical Provider, MD  triamterene-hydrochlorothiazide (MAXZIDE-25) 37.5-25 MG per tablet Take 0.5 tablets by mouth every other day.    Yes Historical Provider, MD   BP 119/68  Pulse 97  Temp(Src) 98.1 F (36.7 C) (Oral)  Resp 18  SpO2 95% Physical Exam  Nursing note and vitals reviewed. Constitutional: Vital signs are normal. He appears well-developed and well-nourished.  Non-toxic appearance. He does not have a sickly appearance. He does not appear ill. No distress.  HENT:  Mouth/Throat: Uvula is midline and oropharynx is clear and moist. Mucous membranes are dry.  Neck: Neck supple.  Cardiovascular: Normal rate.  An irregular rhythm present.  Pulmonary/Chest: Effort normal. Not tachypneic. No respiratory distress. He has no wheezes.  Abdominal: Soft. Normal appearance and bowel sounds are normal. There is tenderness in the right lower quadrant. There is guarding. There is no rigidity and no rebound.  Genitourinary: Rectal exam shows external hemorrhoid. Rectal exam shows no fissure and anal  tone normal.  Dark brown stool in rectal vault no bright red blood on exam glove.  Soft stool in rectal vault. Flatulence on exam. Chaperone present.   Neurological: He is alert.  Oriented to person and city. He is disoriented to month, date, year.  Skin: Skin is warm and dry. He is not diaphoretic.  Psychiatric: He has a normal mood and affect. His behavior is normal.    ED Course  Procedures (including critical care  time) Labs Review Results for orders placed during the hospital encounter of 02/01/14  CBC WITH DIFFERENTIAL      Result Value Ref Range   WBC 20.1 (*) 4.0 - 10.5 K/uL   RBC 4.14 (*) 4.22 - 5.81 MIL/uL   Hemoglobin 11.6 (*) 13.0 - 17.0 g/dL   HCT 35.5 (*) 39.0 - 52.0 %   MCV 85.7  78.0 - 100.0 fL   MCH 28.0  26.0 - 34.0 pg   MCHC 32.7  30.0 - 36.0 g/dL   RDW 15.2  11.5 - 15.5 %   Platelets 503 (*) 150 - 400 K/uL   Neutrophils Relative % 84 (*) 43 - 77 %   Neutro Abs 16.8 (*) 1.7 - 7.7 K/uL   Lymphocytes Relative 8 (*) 12 - 46 %   Lymphs Abs 1.6  0.7 - 4.0 K/uL   Monocytes Relative 8  3 - 12 %   Monocytes Absolute 1.6 (*) 0.1 - 1.0 K/uL   Eosinophils Relative 0  0 - 5 %   Eosinophils Absolute 0.0  0.0 - 0.7 K/uL   Basophils Relative 0  0 - 1 %   Basophils Absolute 0.0  0.0 - 0.1 K/uL  COMPREHENSIVE METABOLIC PANEL      Result Value Ref Range   Sodium 135 (*) 137 - 147 mEq/L   Potassium 4.3  3.7 - 5.3 mEq/L   Chloride 95 (*) 96 - 112 mEq/L   CO2 23  19 - 32 mEq/L   Glucose, Bld 122 (*) 70 - 99 mg/dL   BUN 22  6 - 23 mg/dL   Creatinine, Ser 1.08  0.50 - 1.35 mg/dL   Calcium 9.6  8.4 - 10.5 mg/dL   Total Protein 7.2  6.0 - 8.3 g/dL   Albumin 2.4 (*) 3.5 - 5.2 g/dL   AST 27  0 - 37 U/L   ALT 20  0 - 53 U/L   Alkaline Phosphatase 242 (*) 39 - 117 U/L   Total Bilirubin 0.4  0.3 - 1.2 mg/dL   GFR calc non Af Amer 59 (*) >90 mL/min   GFR calc Af Amer 69 (*) >90 mL/min   Anion gap 17 (*) 5 - 15  URINALYSIS, ROUTINE W REFLEX MICROSCOPIC      Result Value Ref Range   Color, Urine AMBER (*) YELLOW   APPearance CLEAR  CLEAR   Specific Gravity, Urine 1.021  1.005 - 1.030   pH 6.0  5.0 - 8.0   Glucose, UA NEGATIVE  NEGATIVE mg/dL   Hgb urine dipstick NEGATIVE  NEGATIVE   Bilirubin Urine NEGATIVE  NEGATIVE   Ketones, ur NEGATIVE  NEGATIVE mg/dL   Protein, ur NEGATIVE  NEGATIVE mg/dL   Urobilinogen, UA 1.0  0.0 - 1.0 mg/dL   Nitrite NEGATIVE  NEGATIVE   Leukocytes, UA NEGATIVE   NEGATIVE  LIPASE, BLOOD      Result Value Ref Range   Lipase 15  11 - 59 U/L  POC OCCULT BLOOD, ED  Result Value Ref Range   Fecal Occult Bld POSITIVE (*) NEGATIVE   Ct Abdomen Pelvis W Contrast  02/02/2014   CLINICAL DATA:  Emesis and tori stools  EXAM: CT ABDOMEN AND PELVIS WITH CONTRAST  TECHNIQUE: Multidetector CT imaging of the abdomen and pelvis was performed using the standard protocol following bolus administration of intravenous contrast.  CONTRAST:  15m OMNIPAQUE IOHEXOL 300 MG/ML SOLN, 1091mOMNIPAQUE IOHEXOL 300 MG/ML SOLN  COMPARISON:  None.  FINDINGS: The lung bases are clear.  There is no intrahepatic or extrahepatic biliary ductal dilatation. The gallbladder is normal. The spleen demonstrates no focal abnormality.There is a horseshoe kidney. The adrenal glands and pancreas are normal. The bladder is unremarkable.  There is irregular wall thickening involving the hepatic flexure measuring at least 10 cm in length most concerning for malignancy. There are multiple adjacent lymph nodes along the mesenteric aspect of the mass measuring up to 13 mm. The superior margin of the mass abuts the inferior margin of the liver with possible direct invasion. There are multiple hypodense hepatic masses with the largest measuring 16 mm along the inferior right hepatic lobe most concerning for metastatic disease. There is portacaval lymphadenopathy with the largest lymph node measuring 4.6 x 3.3 cm just anterior to the vena cava.  The hepatic flexure mass is resulting in colonic obstruction with severe dilatation of the ascending colon measuring up to 10 cm. There is also small bowel dilatation.  There is diverticulosis without evidence of diverticulitis. There is no pneumoperitoneum, pneumatosis, or portal venous gas. There is no abdominal or pelvic free fluid.  The abdominal aorta is normal in caliber with atherosclerosis.  There are no lytic or sclerotic osseous lesions. There is lumbar spine  spondylosis most severe at L2-3 and L5-S1.  IMPRESSION: 1. Irregular thickening involving the hepatic flexure measuring at least 10 cm in length most concerning for primary colonic malignancy. There are multiple adjacent lymph nodes along the mesenteric aspect of the mass measuring up to 13 mm concerning for local nodal metastasis. The superior margin of the mass abuts the inferior margin of the liver with irregularity at the interface concerning for direct hepatic invasion. There are multiple other hypodense hepatic masses most consistent with metastatic disease. 2. There is bowel obstruction resulting from the hepatic flexure colonic mass.   Electronically Signed   By: HeKathreen Devoid On: 02/02/2014 01:25    EKG Interpretation None      MDM   Final diagnoses:  Intestinal obstruction, unspecified intestinal obstruction type  Liver metastasis   Patient presents with right lower quadrant abdominal pain and 4 episodes of vomiting today. And persistent black stools for several months. Pt is not on anticoagulation.  CT abdomen pelvis to rule out organic cause of Right abdominal pain. No obvious source of bleeding on rectal exam. Dr. MiSabra Hecklso evaluated the patient during this encounter.  Leukocytolysis 20.1 , Hbg 11.6 history of recent transfusions 3 months ago last Hbg 8.3. ALK Phos 242, trending up since 3 months ago. 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 And Bowel obstruction.  Plan to admit for bowel obstruction.   Meds given in ED:  Medications  sodium chloride 0.9 % bolus 1,000 mL (not administered)  sodium chloride 0.9 % bolus 500 mL (0 mLs Intravenous Stopped 02/01/14 2308)    New Prescriptions   No medications on file        LaLorrine KinPA-C 02/03/14 0111

## 2014-02-02 ENCOUNTER — Inpatient Hospital Stay (HOSPITAL_COMMUNITY): Payer: Medicare Other

## 2014-02-02 ENCOUNTER — Encounter (HOSPITAL_COMMUNITY): Payer: Self-pay

## 2014-02-02 DIAGNOSIS — C182 Malignant neoplasm of ascending colon: Principal | ICD-10-CM | POA: Diagnosis present

## 2014-02-02 DIAGNOSIS — D649 Anemia, unspecified: Secondary | ICD-10-CM | POA: Diagnosis present

## 2014-02-02 DIAGNOSIS — R195 Other fecal abnormalities: Secondary | ICD-10-CM | POA: Diagnosis present

## 2014-02-02 DIAGNOSIS — R112 Nausea with vomiting, unspecified: Secondary | ICD-10-CM

## 2014-02-02 DIAGNOSIS — D473 Essential (hemorrhagic) thrombocythemia: Secondary | ICD-10-CM

## 2014-02-02 DIAGNOSIS — E7800 Pure hypercholesterolemia, unspecified: Secondary | ICD-10-CM | POA: Diagnosis present

## 2014-02-02 DIAGNOSIS — I10 Essential (primary) hypertension: Secondary | ICD-10-CM

## 2014-02-02 DIAGNOSIS — D72829 Elevated white blood cell count, unspecified: Secondary | ICD-10-CM

## 2014-02-02 DIAGNOSIS — Z79899 Other long term (current) drug therapy: Secondary | ICD-10-CM | POA: Diagnosis not present

## 2014-02-02 DIAGNOSIS — K631 Perforation of intestine (nontraumatic): Secondary | ICD-10-CM | POA: Diagnosis present

## 2014-02-02 DIAGNOSIS — E785 Hyperlipidemia, unspecified: Secondary | ICD-10-CM | POA: Diagnosis present

## 2014-02-02 DIAGNOSIS — Z515 Encounter for palliative care: Secondary | ICD-10-CM | POA: Diagnosis not present

## 2014-02-02 DIAGNOSIS — F0390 Unspecified dementia without behavioral disturbance: Secondary | ICD-10-CM

## 2014-02-02 DIAGNOSIS — K5669 Other intestinal obstruction: Secondary | ICD-10-CM | POA: Diagnosis present

## 2014-02-02 DIAGNOSIS — Z85828 Personal history of other malignant neoplasm of skin: Secondary | ICD-10-CM | POA: Diagnosis not present

## 2014-02-02 DIAGNOSIS — K921 Melena: Secondary | ICD-10-CM | POA: Diagnosis present

## 2014-02-02 DIAGNOSIS — K56609 Unspecified intestinal obstruction, unspecified as to partial versus complete obstruction: Secondary | ICD-10-CM | POA: Diagnosis not present

## 2014-02-02 DIAGNOSIS — D75839 Thrombocytosis, unspecified: Secondary | ICD-10-CM | POA: Diagnosis present

## 2014-02-02 DIAGNOSIS — C189 Malignant neoplasm of colon, unspecified: Secondary | ICD-10-CM

## 2014-02-02 DIAGNOSIS — K769 Liver disease, unspecified: Secondary | ICD-10-CM | POA: Diagnosis not present

## 2014-02-02 DIAGNOSIS — C787 Secondary malignant neoplasm of liver and intrahepatic bile duct: Secondary | ICD-10-CM | POA: Diagnosis present

## 2014-02-02 DIAGNOSIS — Z8546 Personal history of malignant neoplasm of prostate: Secondary | ICD-10-CM | POA: Diagnosis not present

## 2014-02-02 DIAGNOSIS — R160 Hepatomegaly, not elsewhere classified: Secondary | ICD-10-CM | POA: Diagnosis present

## 2014-02-02 DIAGNOSIS — Z66 Do not resuscitate: Secondary | ICD-10-CM | POA: Diagnosis not present

## 2014-02-02 LAB — BASIC METABOLIC PANEL
ANION GAP: 14 (ref 5–15)
BUN: 21 mg/dL (ref 6–23)
CHLORIDE: 97 meq/L (ref 96–112)
CO2: 24 mEq/L (ref 19–32)
Calcium: 9 mg/dL (ref 8.4–10.5)
Creatinine, Ser: 1.06 mg/dL (ref 0.50–1.35)
GFR calc non Af Amer: 61 mL/min — ABNORMAL LOW (ref >90)
GFR, EST AFRICAN AMERICAN: 70 mL/min — AB (ref >90)
Glucose, Bld: 116 mg/dL — ABNORMAL HIGH (ref 70–99)
Potassium: 4.3 mEq/L (ref 3.7–5.3)
Sodium: 135 mEq/L — ABNORMAL LOW (ref 137–147)

## 2014-02-02 LAB — URINALYSIS, ROUTINE W REFLEX MICROSCOPIC
Bilirubin Urine: NEGATIVE
Glucose, UA: NEGATIVE mg/dL
HGB URINE DIPSTICK: NEGATIVE
Ketones, ur: NEGATIVE mg/dL
Leukocytes, UA: NEGATIVE
Nitrite: NEGATIVE
Protein, ur: NEGATIVE mg/dL
SPECIFIC GRAVITY, URINE: 1.021 (ref 1.005–1.030)
Urobilinogen, UA: 1 mg/dL (ref 0.0–1.0)
pH: 6 (ref 5.0–8.0)

## 2014-02-02 LAB — CBC
HCT: 33.2 % — ABNORMAL LOW (ref 39.0–52.0)
HEMOGLOBIN: 10.6 g/dL — AB (ref 13.0–17.0)
MCH: 27.7 pg (ref 26.0–34.0)
MCHC: 31.9 g/dL (ref 30.0–36.0)
MCV: 86.7 fL (ref 78.0–100.0)
Platelets: 476 10*3/uL — ABNORMAL HIGH (ref 150–400)
RBC: 3.83 MIL/uL — AB (ref 4.22–5.81)
RDW: 15.3 % (ref 11.5–15.5)
WBC: 21.2 10*3/uL — ABNORMAL HIGH (ref 4.0–10.5)

## 2014-02-02 LAB — HEMOGLOBIN AND HEMATOCRIT, BLOOD
HCT: 32.7 % — ABNORMAL LOW (ref 39.0–52.0)
HEMATOCRIT: 33.7 % — AB (ref 39.0–52.0)
HEMATOCRIT: 33 % — AB (ref 39.0–52.0)
HEMOGLOBIN: 11 g/dL — AB (ref 13.0–17.0)
HEMOGLOBIN: 11.3 g/dL — AB (ref 13.0–17.0)
Hemoglobin: 11.1 g/dL — ABNORMAL LOW (ref 13.0–17.0)

## 2014-02-02 LAB — LIPASE, BLOOD: Lipase: 15 U/L (ref 11–59)

## 2014-02-02 LAB — RETICULOCYTES
RBC.: 4.01 MIL/uL — AB (ref 4.22–5.81)
RETIC COUNT ABSOLUTE: 60.2 10*3/uL (ref 19.0–186.0)
Retic Ct Pct: 1.5 % (ref 0.4–3.1)

## 2014-02-02 LAB — TYPE AND SCREEN
ABO/RH(D): O POS
Antibody Screen: NEGATIVE

## 2014-02-02 LAB — IRON AND TIBC
IRON: 24 ug/dL — AB (ref 42–135)
Saturation Ratios: 16 % — ABNORMAL LOW (ref 20–55)
TIBC: 150 ug/dL — AB (ref 215–435)
UIBC: 126 ug/dL (ref 125–400)

## 2014-02-02 LAB — VITAMIN B12: Vitamin B-12: 1443 pg/mL — ABNORMAL HIGH (ref 211–911)

## 2014-02-02 LAB — I-STAT CG4 LACTIC ACID, ED: LACTIC ACID, VENOUS: 2.22 mmol/L — AB (ref 0.5–2.2)

## 2014-02-02 LAB — FOLATE: Folate: >20 ng/mL

## 2014-02-02 LAB — FERRITIN: Ferritin: 464 ng/mL — ABNORMAL HIGH (ref 22–322)

## 2014-02-02 MED ORDER — ACETAMINOPHEN 325 MG PO TABS: 650.0000 mg | ORAL_TABLET | Freq: Four times a day (QID) | ORAL | Status: DC | PRN

## 2014-02-02 MED ORDER — SODIUM CHLORIDE 0.9 % IV SOLN
INTRAVENOUS | Status: DC
  Administered 2014-02-02 – 2014-02-06 (×10): via INTRAVENOUS

## 2014-02-02 MED ORDER — IOHEXOL 300 MG/ML  SOLN
50.0000 mL | Freq: Once | INTRAMUSCULAR | Status: AC | PRN
  Administered 2014-02-02: 20 mL via ORAL

## 2014-02-02 MED ORDER — ONDANSETRON HCL 4 MG/2ML IJ SOLN: 4.0000 mg | Freq: Four times a day (QID) | INTRAMUSCULAR | Status: DC | PRN

## 2014-02-02 MED ORDER — ONDANSETRON HCL 4 MG PO TABS: 4.0000 mg | ORAL_TABLET | Freq: Four times a day (QID) | ORAL | Status: DC | PRN

## 2014-02-02 MED ORDER — CHLORHEXIDINE GLUCONATE 0.12 % MT SOLN
15.0000 mL | Freq: Two times a day (BID) | OROMUCOSAL | Status: DC
  Administered 2014-02-02 – 2014-02-06 (×9): 15 mL via OROMUCOSAL
  Filled 2014-02-02 (×11): qty 15

## 2014-02-02 MED ORDER — PANTOPRAZOLE SODIUM 40 MG IV SOLR
40.0000 mg | Freq: Two times a day (BID) | INTRAVENOUS | Status: DC
  Administered 2014-02-02 – 2014-02-05 (×8): 40 mg via INTRAVENOUS
  Filled 2014-02-02 (×11): qty 40

## 2014-02-02 MED ORDER — IOHEXOL 300 MG/ML  SOLN
100.0000 mL | Freq: Once | INTRAMUSCULAR | Status: AC | PRN
  Administered 2014-02-02: 100 mL via INTRAVENOUS

## 2014-02-02 MED ORDER — HYDROMORPHONE HCL PF 1 MG/ML IJ SOLN: 0.5000 mg | INTRAMUSCULAR | Status: DC | PRN

## 2014-02-02 MED ORDER — BIOTENE DRY MOUTH MT LIQD
15.0000 mL | Freq: Two times a day (BID) | OROMUCOSAL | Status: DC
  Administered 2014-02-03 – 2014-02-05 (×6): 15 mL via OROMUCOSAL

## 2014-02-02 MED ORDER — ACETAMINOPHEN 650 MG RE SUPP: 650.0000 mg | Freq: Four times a day (QID) | RECTAL | Status: DC | PRN

## 2014-02-02 MED ORDER — OXYCODONE HCL 5 MG PO TABS: 5.0000 mg | ORAL_TABLET | ORAL | Status: DC | PRN

## 2014-02-02 MED ORDER — SODIUM CHLORIDE 0.9 % IV SOLN
INTRAVENOUS | Status: DC
Start: 2014-02-02 — End: 2014-02-02

## 2014-02-02 MED ORDER — SODIUM CHLORIDE 0.9 % IV BOLUS (SEPSIS)
1000.0000 mL | Freq: Once | INTRAVENOUS | Status: AC
  Administered 2014-02-02: 1000 mL via INTRAVENOUS

## 2014-02-02 NOTE — H&P (Signed)
[facility] Admission History and Physical       Nathan Glenn PJA:[id number] DOB: 16-Oct-1923 DOA: 02/01/2014  Referring physician: EDP PCP: Jerlyn Ly, MD  Specialists:   Chief Complaint:  Nausea and Vomiting  HPI: Nathan Glenn is a 80 y.o. male with a history of Dementia, HTN, Hyperlipidemia and remote history of Prostate Cancer, and skin Cancer who was brought to the Ed by his family due to 4 episodes of N+V  During the day.  The family also reported that the patient has been having tarry stools for the past 3 months.   The patient has Dementia and is not able to give his medical history.   The family spoke to the EDP and left and were unable to be reached by telephone afterward.   The patient denies any pain currently. Patient was sent for a CT scan of the ABD which returned revealing an Obstructing Colonic Mass at the Hepatic Flexure along with multiple hyperechoic lesions in the Liver.   The EDP placed an NGT and the patient was referred for medical admission.  A General Surgery consultation was also placed.     Review of Systems:  Unable to Obtain from the Patient   Past Medical History  Diagnosis Date  . Dementia   . Hypertension   . High cholesterol   . Anemia   . Cancer     skin cancer, prostate cancer     History reviewed. No pertinent past surgical history.     Prior to Admission medications   Medication Sig Start Date End Date Taking? Authorizing Provider  atorvastatin (LIPITOR) 10 MG tablet Take 10 mg by mouth daily.   Yes Historical Provider, MD  cholecalciferol (VITAMIN D) 400 UNITS TABS tablet Take 400 Units by mouth daily.   Yes Historical Provider, MD  ferrous sulfate 325 (65 FE) MG tablet Take 325 mg by mouth 2 (two) times daily with a meal.   Yes Historical Provider, MD  galantamine (RAZADYNE ER) 16 MG 24 hr capsule Take 16 mg by mouth every evening.    Yes Historical Provider, MD  metoprolol succinate (TOPROL-XL) 50 MG 24 hr tablet  Take 25 mg by mouth daily. Take with or immediately following a meal.  Pt takes toprol, unsure of milligrams, takes 1/2 tablet once a day   Yes Historical Provider, MD  Omega-3 Fatty Acids (FISH OIL) 1000 MG CAPS Take 1 capsule by mouth daily.   Yes Historical Provider, MD  triamterene-hydrochlorothiazide (MAXZIDE-25) 37.5-25 MG per tablet Take 0.5 tablets by mouth every other day.    Yes Historical Provider, MD      No Known Allergies   Social History:  reports that he has never smoked. He does not have any smokeless tobacco history on file. He reports that he does not drink alcohol. His drug history is not on file.     History reviewed. No pertinent family history.     Physical Exam:  GEN:  Pleasant Elderly Obese 80 y.o. Caucasian male examined  and in no acute distress; cooperative with exam Filed Vitals:   02/01/14 2300 02/01/14 2315 02/01/14 2330 02/02/14 0049  BP: 112/64 144/75 118/63   Pulse: 175 86 91   Temp:    99 F (37.2 C)  TempSrc:    Rectal  Resp: 21 21 14    SpO2: 89% 93% 93%    Blood pressure 118/63, pulse 91, temperature 99 F (37.2 C), temperature source Rectal, resp. rate 14, SpO2 93.00%. PSYCH: He  is alert and oriented x2; does not appear anxious does not appear depressed; affect is normal HEENT: Normocephalic and Atraumatic, NGT present, Mucous membranes pink; PERRLA; EOM intact; Fundi:  Benign;  No scleral icterus, Nares: Patent, Oropharynx: Clear, Fair Dentition,    Neck:  FROM, No Cervical Lymphadenopathy nor Thyromegaly or Carotid Bruit; No JVD; Breasts:: Not examined CHEST WALL: No tenderness CHEST: Normal respiration, clear to auscultation bilaterally HEART: Regular rate and rhythm; no murmurs rubs or gallops BACK: No kyphosis or scoliosis; No CVA tenderness ABDOMEN: Positive Bowel Sounds, Obese, Soft Non-Tender; No Masses, No Organomegaly.   Rectal Exam: Not done EXTREMITIES: No Cyanosis, Clubbing, or Edema; No Ulcerations. Genitalia: not  examined PULSES: 2+ and symmetric SKIN: Normal hydration no rash or ulceration CNS:   Alert and Oriented x 2,  No Focal Deficits  Vascular: pulses palpable throughout    Labs on Admission:  Basic Metabolic Panel:  Recent Labs Lab 02/01/14 2213  NA 135*  K 4.3  CL 95*  CO2 23  GLUCOSE 122*  BUN 22  CREATININE 1.08  CALCIUM 9.6   Liver Function Tests:  Recent Labs Lab 02/01/14 2213  AST 27  ALT 20  ALKPHOS 242*  BILITOT 0.4  PROT 7.2  ALBUMIN 2.4*    Recent Labs Lab 02/01/14 2213  LIPASE 15   No results found for this basename: AMMONIA,  in the last 168 hours CBC:  Recent Labs Lab 02/01/14 2213  WBC 20.1*  NEUTROABS 16.8*  HGB 11.6*  HCT 35.5*  MCV 85.7  PLT 503*   Cardiac Enzymes: No results found for this basename: CKTOTAL, CKMB, CKMBINDEX, TROPONINI,  in the last 168 hours  BNP (last 3 results) No results found for this basename: PROBNP,  in the last 8760 hours CBG: No results found for this basename: GLUCAP,  in the last 168 hours  Radiological Exams on Admission: Ct Abdomen Pelvis W Contrast  02/02/2014   CLINICAL DATA:  Emesis and tori stools  EXAM: CT ABDOMEN AND PELVIS WITH CONTRAST  TECHNIQUE: Multidetector CT imaging of the abdomen and pelvis was performed using the standard protocol following bolus administration of intravenous contrast.  CONTRAST:  78mL OMNIPAQUE IOHEXOL 300 MG/ML SOLN, 122mL OMNIPAQUE IOHEXOL 300 MG/ML SOLN  COMPARISON:  None.  FINDINGS: The lung bases are clear.  There is no intrahepatic or extrahepatic biliary ductal dilatation. The gallbladder is normal. The spleen demonstrates no focal abnormality.There is a horseshoe kidney. The adrenal glands and pancreas are normal. The bladder is unremarkable.  There is irregular wall thickening involving the hepatic flexure measuring at least 10 cm in length most concerning for malignancy. There are multiple adjacent lymph nodes along the mesenteric aspect of the mass measuring up to  13 mm. The superior margin of the mass abuts the inferior margin of the liver with possible direct invasion. There are multiple hypodense hepatic masses with the largest measuring 16 mm along the inferior right hepatic lobe most concerning for metastatic disease. There is portacaval lymphadenopathy with the largest lymph node measuring 4.6 x 3.3 cm just anterior to the vena cava.  The hepatic flexure mass is resulting in colonic obstruction with severe dilatation of the ascending colon measuring up to 10 cm. There is also small bowel dilatation.  There is diverticulosis without evidence of diverticulitis. There is no pneumoperitoneum, pneumatosis, or portal venous gas. There is no abdominal or pelvic free fluid.  The abdominal aorta is normal in caliber with atherosclerosis.  There are no lytic or sclerotic  osseous lesions. There is lumbar spine spondylosis most severe at L2-3 and L5-S1.  IMPRESSION: 1. Irregular thickening involving the hepatic flexure measuring at least 10 cm in length most concerning for primary colonic malignancy. There are multiple adjacent lymph nodes along the mesenteric aspect of the mass measuring up to 13 mm concerning for local nodal metastasis. The superior margin of the mass abuts the inferior margin of the liver with irregularity at the interface concerning for direct hepatic invasion. There are multiple other hypodense hepatic masses most consistent with metastatic disease. 2. There is bowel obstruction resulting from the hepatic flexure colonic mass.   Electronically Signed   By: Kathreen Devoid   On: 02/02/2014 01:25       Assessment/Plan:   80 y.o. male with  Principal Problem: 1.    Bowel obstruction:   Due to Colonic Mass/ Neoplasm    NPO, IVFs, NGT to LIMS, and General Surgery consulted to see this AM   Active Problems: 2.    Liver masses:   Probable Metastatic Disease   Needs Tissue diagnosis, and Oncology evaluation if family wishes to pursue   3.    Heme  positive stool:     Monitor H/Hs, transfuse if needed.   IV Protonix BID   4.    Anemia:   Normocytic, and also due to #3.      Send Anemia panel, Monitor Trend.      5.    Nausea and vomiting:     Anti-emetics PRN.      6.    Leukocytosis:  Stress Rxn versus Infection   Monitor Trend.     7.   Thrombocytosis:   Stress or Acute Phase Reactant.    Monitor Trend.     8.    Benign essential hypertension:     On Triamterene/HCTZ , but on hold while NPO.      9.    Hyperlipidemia:      On Hold while NPO,  Atorvastatin Rx, and Omega 3 Fatty Acids.      10.  Dementia:     On Razodyne Rx.      11.  DVT Prophylaxis:      With Lovenox.          Code Status:   FULL CODE, Unable to Reach Family to discuss Family Communication:  No Family Present   Disposition Plan:     Inpatient    Time spent:  [place] [facility] Pager [phone]   If [place] Please Contact the Day Rounding Team MD for [facility]  If 7PM-7AM, Please Contact night-coverage  [email or web address] Password [email or web address] 02/02/2014, 2:42 AM

## 2014-02-02 NOTE — Consult Note (Signed)
Reason for Consult:rectal bleeding, abd pain, malignant bowel obstruction Referring Physician: Dr Synetta Fail is an 80 y.o. male.  HPI: Pleasantly demented male presents from home by ambulance complaining of rectal bleeding per paramedics. The patient is unable to give any history given his underlying dementia.  He has no complaints.  Denies nausea.     Past Medical History  Diagnosis Date  . Dementia   . Hypertension   . High cholesterol   . Anemia   . Cancer     skin cancer, prostate cancer    History reviewed. No pertinent past surgical history. Unable to obtain  History reviewed. No pertinent family history. Unable to obtain  Social History:  reports that he has never smoked. He does not have any smokeless tobacco history on file. He reports that he does not drink alcohol. His drug history is not on file.  Allergies: No Known Allergies  Medications: I have reviewed the patient's current medications.  Results for orders placed during the hospital encounter of 02/01/14 (from the past 48 hour(s))  CBC WITH DIFFERENTIAL     Status: Abnormal   Collection Time    02/01/14 10:13 PM      Result Value Ref Range   WBC 20.1 (*) 4.0 - 10.5 K/uL   RBC 4.14 (*) 4.22 - 5.81 MIL/uL   Hemoglobin 11.6 (*) 13.0 - 17.0 g/dL   HCT 35.5 (*) 39.0 - 52.0 %   MCV 85.7  78.0 - 100.0 fL   MCH 28.0  26.0 - 34.0 pg   MCHC 32.7  30.0 - 36.0 g/dL   RDW 15.2  11.5 - 15.5 %   Platelets 503 (*) 150 - 400 K/uL   Neutrophils Relative % 84 (*) 43 - 77 %   Neutro Abs 16.8 (*) 1.7 - 7.7 K/uL   Lymphocytes Relative 8 (*) 12 - 46 %   Lymphs Abs 1.6  0.7 - 4.0 K/uL   Monocytes Relative 8  3 - 12 %   Monocytes Absolute 1.6 (*) 0.1 - 1.0 K/uL   Eosinophils Relative 0  0 - 5 %   Eosinophils Absolute 0.0  0.0 - 0.7 K/uL   Basophils Relative 0  0 - 1 %   Basophils Absolute 0.0  0.0 - 0.1 K/uL  COMPREHENSIVE METABOLIC PANEL     Status: Abnormal   Collection Time    02/01/14 10:13 PM       Result Value Ref Range   Sodium 135 (*) 137 - 147 mEq/L   Potassium 4.3  3.7 - 5.3 mEq/L   Chloride 95 (*) 96 - 112 mEq/L   CO2 23  19 - 32 mEq/L   Glucose, Bld 122 (*) 70 - 99 mg/dL   BUN 22  6 - 23 mg/dL   Creatinine, Ser 1.08  0.50 - 1.35 mg/dL   Calcium 9.6  8.4 - 10.5 mg/dL   Total Protein 7.2  6.0 - 8.3 g/dL   Albumin 2.4 (*) 3.5 - 5.2 g/dL   AST 27  0 - 37 U/L   ALT 20  0 - 53 U/L   Alkaline Phosphatase 242 (*) 39 - 117 U/L   Total Bilirubin 0.4  0.3 - 1.2 mg/dL   GFR calc non Af Amer 59 (*) >90 mL/min   GFR calc Af Amer 69 (*) >90 mL/min   Comment: (NOTE)     The eGFR has been calculated using the CKD EPI equation.     This  calculation has not been validated in all clinical situations.     eGFR's persistently <90 mL/min signify possible Chronic Kidney     Disease.   Anion gap 17 (*) 5 - 15  LIPASE, BLOOD     Status: None   Collection Time    02/01/14 10:13 PM      Result Value Ref Range   Lipase 15  11 - 59 U/L  POC OCCULT BLOOD, ED     Status: Abnormal   Collection Time    02/01/14 10:40 PM      Result Value Ref Range   Fecal Occult Bld POSITIVE (*) NEGATIVE  URINALYSIS, ROUTINE W REFLEX MICROSCOPIC     Status: Abnormal   Collection Time    02/02/14 12:25 AM      Result Value Ref Range   Color, Urine AMBER (*) YELLOW   Comment: BIOCHEMICALS MAY BE AFFECTED BY COLOR   APPearance CLEAR  CLEAR   Specific Gravity, Urine 1.021  1.005 - 1.030   pH 6.0  5.0 - 8.0   Glucose, UA NEGATIVE  NEGATIVE mg/dL   Hgb urine dipstick NEGATIVE  NEGATIVE   Bilirubin Urine NEGATIVE  NEGATIVE   Ketones, ur NEGATIVE  NEGATIVE mg/dL   Protein, ur NEGATIVE  NEGATIVE mg/dL   Urobilinogen, UA 1.0  0.0 - 1.0 mg/dL   Nitrite NEGATIVE  NEGATIVE   Leukocytes, UA NEGATIVE  NEGATIVE   Comment: MICROSCOPIC NOT DONE ON URINES WITH NEGATIVE PROTEIN, BLOOD, LEUKOCYTES, NITRITE, OR GLUCOSE <1000 mg/dL.  I-STAT CG4 LACTIC ACID, ED     Status: Abnormal   Collection Time    02/02/14  2:03 AM       Result Value Ref Range   Lactic Acid, Venous 2.22 (*) 0.5 - 2.2 mmol/L  BASIC METABOLIC PANEL     Status: Abnormal   Collection Time    02/02/14  5:43 AM      Result Value Ref Range   Sodium 135 (*) 137 - 147 mEq/L   Potassium 4.3  3.7 - 5.3 mEq/L   Chloride 97  96 - 112 mEq/L   CO2 24  19 - 32 mEq/L   Glucose, Bld 116 (*) 70 - 99 mg/dL   BUN 21  6 - 23 mg/dL   Creatinine, Ser 1.06  0.50 - 1.35 mg/dL   Calcium 9.0  8.4 - 10.5 mg/dL   GFR calc non Af Amer 61 (*) >90 mL/min   GFR calc Af Amer 70 (*) >90 mL/min   Comment: (NOTE)     The eGFR has been calculated using the CKD EPI equation.     This calculation has not been validated in all clinical situations.     eGFR's persistently <90 mL/min signify possible Chronic Kidney     Disease.   Anion gap 14  5 - 15  CBC     Status: Abnormal   Collection Time    02/02/14  5:43 AM      Result Value Ref Range   WBC 21.2 (*) 4.0 - 10.5 K/uL   RBC 3.83 (*) 4.22 - 5.81 MIL/uL   Hemoglobin 10.6 (*) 13.0 - 17.0 g/dL   HCT 33.2 (*) 39.0 - 52.0 %   MCV 86.7  78.0 - 100.0 fL   MCH 27.7  26.0 - 34.0 pg   MCHC 31.9  30.0 - 36.0 g/dL   RDW 15.3  11.5 - 15.5 %   Platelets 476 (*) 150 - 400 K/uL  Ct Abdomen Pelvis W Contrast  02/02/2014   CLINICAL DATA:  Emesis and tori stools  EXAM: CT ABDOMEN AND PELVIS WITH CONTRAST  TECHNIQUE: Multidetector CT imaging of the abdomen and pelvis was performed using the standard protocol following bolus administration of intravenous contrast.  CONTRAST:  39m OMNIPAQUE IOHEXOL 300 MG/ML SOLN, 1038mOMNIPAQUE IOHEXOL 300 MG/ML SOLN  COMPARISON:  None.  FINDINGS: The lung bases are clear.  There is no intrahepatic or extrahepatic biliary ductal dilatation. The gallbladder is normal. The spleen demonstrates no focal abnormality.There is a horseshoe kidney. The adrenal glands and pancreas are normal. The bladder is unremarkable.  There is irregular wall thickening involving the hepatic flexure measuring at least 10 cm  in length most concerning for malignancy. There are multiple adjacent lymph nodes along the mesenteric aspect of the mass measuring up to 13 mm. The superior margin of the mass abuts the inferior margin of the liver with possible direct invasion. There are multiple hypodense hepatic masses with the largest measuring 16 mm along the inferior right hepatic lobe most concerning for metastatic disease. There is portacaval lymphadenopathy with the largest lymph node measuring 4.6 x 3.3 cm just anterior to the vena cava.  The hepatic flexure mass is resulting in colonic obstruction with severe dilatation of the ascending colon measuring up to 10 cm. There is also small bowel dilatation.  There is diverticulosis without evidence of diverticulitis. There is no pneumoperitoneum, pneumatosis, or portal venous gas. There is no abdominal or pelvic free fluid.  The abdominal aorta is normal in caliber with atherosclerosis.  There are no lytic or sclerotic osseous lesions. There is lumbar spine spondylosis most severe at L2-3 and L5-S1.  IMPRESSION: 1. Irregular thickening involving the hepatic flexure measuring at least 10 cm in length most concerning for primary colonic malignancy. There are multiple adjacent lymph nodes along the mesenteric aspect of the mass measuring up to 13 mm concerning for local nodal metastasis. The superior margin of the mass abuts the inferior margin of the liver with irregularity at the interface concerning for direct hepatic invasion. There are multiple other hypodense hepatic masses most consistent with metastatic disease. 2. There is bowel obstruction resulting from the hepatic flexure colonic mass.   Electronically Signed   By: HeKathreen Devoid On: 02/02/2014 01:25    Review of Systems  Constitutional: Negative for fever and chills.  Eyes: Negative for blurred vision.  Respiratory: Negative for cough and shortness of breath.   Cardiovascular: Negative for chest pain.  Gastrointestinal:  Positive for nausea, abdominal pain and constipation. Negative for vomiting.  Genitourinary: Negative for dysuria, urgency and frequency.  Neurological: Negative for headaches.  Psychiatric/Behavioral:       Pt demented, not oriented to place or time   Blood pressure 120/71, pulse 79, temperature 97.8 F (36.6 C), temperature source Oral, resp. rate 14, height 6' (1.829 m), SpO2 94.00%. Physical Exam  Constitutional: He appears well-developed and well-nourished.  HENT:  Head: Normocephalic and atraumatic.  Eyes: Pupils are equal, round, and reactive to light.  Neck: Normal range of motion.  Cardiovascular: Regular rhythm.   Respiratory: Effort normal and breath sounds normal. No respiratory distress.  GI: He exhibits distension. There is tenderness.  abd tense, more tender on right side  Skin: Skin is warm and dry.    Assessment/Plan: Pt with malignant bowel obstruction due to hepatic flexure mass.  Most likely impending perforation with metastatic colon cancer.  Radiology to insert NG.  Attempting to  get in touch with POA to determine patient's wished for end of life issues.  Surgical diversion or resection vs Hospice care and medical management.    Mrk, Nuria Phebus C. 1/47/0929, 5:74 AM

## 2014-02-02 NOTE — Progress Notes (Signed)
Follow up note  I spoke with patient's wife and son this afternoon. Goals of care were discussed. I explained the burden of cardiopulmonary resuscitation and the low likelihood of meaningful recovery. Family agreed that DNR code status would be appropriate. Family also leaning towards transitioning to comfort rather than pursuing further aggressive therapy. Palliative care consulted.  DNR code status placed in chart

## 2014-02-02 NOTE — Progress Notes (Signed)
NG tube attempted by 3 RNs.  Patient unable to tolerate.  Jonette Eva NP notified.  Patient BP 80/60.  Orders received for fluid bolus and NP will place order for NG in radiology after BP issues corrected.

## 2014-02-02 NOTE — Progress Notes (Signed)
[facility] PROGRESS NOTE  Nathan Glenn:[id number] DOB: [birth year] DOA: 02/01/2014 PCP: Jerlyn Ly, MD  Assessment/Plan: 1. Bowel Obstruction Secondary to Malignancy -Patient presenting with several episodes of nausea and vomiting. -CT scan of ABD/Pelvis down overnight showed irregular thickening involving the hepatic flexure measuring at least 10 cm in length most concerning for primary colonic malignancy.  -Dr Marcello Moores of General Surgery consulted.  -NT tube to be placed by IR -I spoke with his wife Nathan Glenn over telephone regarding goals of care and treatment options. She would like to discuss this further with her son.  -Continue IV fluids/supportive care   2. Intractable Nausea/Vomiting -Secondary to bowel obstruction from underlying malignancy -IR to place NG tube  3. Advanced Dementia -Stable  4. Goals of Care. I discussed code status with his wife over telephone. The likelihood of a hopefull recovery in the event of cardiopulmonary arrest was low. I discussed the burden of putting him through chest compressions and intubation and that  With his advanced illness likely would not change end point. Nathan Glenn stating "I'm not sure what to think, I just can't make a decision now." She would like to talk to their son further regarding code status as well as treatment options for malignant bowel obstruction.   Code Status: Full Code Family Communication: Spoke with his wife Nathan Glenn over telephone Disposition Plan: Will hold family meeting to discuss goals of care and treatment options.   Consultants:  General surgery  HPI/Subjective: Nathan Glenn is a 80 y.o. male with a history of Dementia, HTN, Hyperlipidemia and remote history of Prostate Cancer, and skin Cancer who was brought to the emergency department at [facility]. Family calling EMS after patient had 4 episodes of nonbloody emesis. Family also reported black tarry stools. A CT scan of ABD/Pelvis   showed irregular thickening involving the hepatic flexure measuring at least 10 cm in length most concerning for primary colonic malignancy. There are multiple adjacent lymph nodes along the mesenteric aspect of the mass measuring up to 13 mm concerning for local nodal metastasis. The superior margin of the mass abuts the inferior margin of the liver with irregularity at the interface concerning for direct hepatic invasion. There are multiple other hypodense hepatic masses most consistent with metastatic disease. Dr Marcello Moores of General Surgery Consulted.      Objective: Filed Vitals:   02/02/14 0539  BP:   Pulse:   Temp: 97.8 F (36.6 C)  Resp: 14    Intake/Output Summary (Last 24 hours) at 02/02/14 0953 Last data filed at 02/02/14 0800  Gross per 24 hour  Intake 1318.75 ml  Output    100 ml  Net 1218.75 ml   There were no vitals filed for this visit.  Exam:   General:  Elderly male, demented, no distress  Cardiovascular: Regular rate and rhythm normal S1S2, no edema  Respiratory: Clear to auscultation  Abdomen: Abdomen is tender to palpation across all 4 quadrants. There was no rebound tenderness of peritoneal signs.   Musculoskeletal: no edema  Data Reviewed: Basic Metabolic Panel:  Recent Labs Lab 02/01/14 2213 02/02/14 0543  NA 135* 135*  K 4.3 4.3  CL 95* 97  CO2 23 24  GLUCOSE 122* 116*  BUN 22 21  CREATININE 1.08 1.06  CALCIUM 9.6 9.0   Liver Function Tests:  Recent Labs Lab 02/01/14 2213  AST 27  ALT 20  ALKPHOS 242*  BILITOT 0.4  PROT 7.2  ALBUMIN 2.4*    Recent  Labs Lab 02/01/14 2213  LIPASE 15   No results found for this basename: AMMONIA,  in the last 168 hours CBC:  Recent Labs Lab 02/01/14 2213 02/02/14 0543 02/02/14 0805  WBC 20.1* 21.2*  --   NEUTROABS 16.8*  --   --   HGB 11.6* 10.6* 11.3*  HCT 35.5* 33.2* 33.7*  MCV 85.7 86.7  --   PLT 503* 476*  --    Cardiac Enzymes: No results found for this basename: CKTOTAL,  CKMB, CKMBINDEX, TROPONINI,  in the last 168 hours BNP (last 3 results) No results found for this basename: PROBNP,  in the last 8760 hours CBG: No results found for this basename: GLUCAP,  in the last 168 hours  No results found for this or any previous visit (from the past 240 hour(s)).   Studies: Ct Abdomen Pelvis W Contrast  02/02/2014   CLINICAL DATA:  Emesis and tori stools  EXAM: CT ABDOMEN AND PELVIS WITH CONTRAST  TECHNIQUE: Multidetector CT imaging of the abdomen and pelvis was performed using the standard protocol following bolus administration of intravenous contrast.  CONTRAST:  49mL OMNIPAQUE IOHEXOL 300 MG/ML SOLN, 131mL OMNIPAQUE IOHEXOL 300 MG/ML SOLN  COMPARISON:  None.  FINDINGS: The lung bases are clear.  There is no intrahepatic or extrahepatic biliary ductal dilatation. The gallbladder is normal. The spleen demonstrates no focal abnormality.There is a horseshoe kidney. The adrenal glands and pancreas are normal. The bladder is unremarkable.  There is irregular wall thickening involving the hepatic flexure measuring at least 10 cm in length most concerning for malignancy. There are multiple adjacent lymph nodes along the mesenteric aspect of the mass measuring up to 13 mm. The superior margin of the mass abuts the inferior margin of the liver with possible direct invasion. There are multiple hypodense hepatic masses with the largest measuring 16 mm along the inferior right hepatic lobe most concerning for metastatic disease. There is portacaval lymphadenopathy with the largest lymph node measuring 4.6 x 3.3 cm just anterior to the vena cava.  The hepatic flexure mass is resulting in colonic obstruction with severe dilatation of the ascending colon measuring up to 10 cm. There is also small bowel dilatation.  There is diverticulosis without evidence of diverticulitis. There is no pneumoperitoneum, pneumatosis, or portal venous gas. There is no abdominal or pelvic free fluid.  The  abdominal aorta is normal in caliber with atherosclerosis.  There are no lytic or sclerotic osseous lesions. There is lumbar spine spondylosis most severe at L2-3 and L5-S1.  IMPRESSION: 1. Irregular thickening involving the hepatic flexure measuring at least 10 cm in length most concerning for primary colonic malignancy. There are multiple adjacent lymph nodes along the mesenteric aspect of the mass measuring up to 13 mm concerning for local nodal metastasis. The superior margin of the mass abuts the inferior margin of the liver with irregularity at the interface concerning for direct hepatic invasion. There are multiple other hypodense hepatic masses most consistent with metastatic disease. 2. There is bowel obstruction resulting from the hepatic flexure colonic mass.   Electronically Signed   By: Kathreen Devoid   On: 02/02/2014 01:25    Scheduled Meds: . antiseptic oral rinse  15 mL Mouth Rinse q12n4p  . chlorhexidine  15 mL Mouth Rinse BID  . pantoprazole (PROTONIX) IV  40 mg Intravenous Q12H   Continuous Infusions: . sodium chloride 125 mL/hr at 02/02/14 0527    Principal Problem:   Bowel obstruction Active Problems:   Liver  masses   Nausea and vomiting   Heme positive stool   Anemia   Leukocytosis   Thrombocytosis   Benign essential hypertension   Hyperlipidemia   Dementia   Malignant neoplasm of ascending colon    Time spent:     Kelvin Cellar  [facility] Pager [phone]. If 7PM-7AM, please contact night-coverage at [email or web address], password [facility] 02/02/2014, 9:53 AM  LOS: 1 day

## 2014-02-02 NOTE — Progress Notes (Signed)
Unable to obtain accurate hospital history admission due to patient dementia no family present in ED or at bedside.

## 2014-02-02 NOTE — ED Provider Notes (Addendum)
80 year old pleasantly demented male presents from home by ambulance complaining of rectal bleeding per paramedics. The patient is unable to give any history given his underlying dementia, he has no complaints but on exam the patient does have tenderness in the right lower and right mid abdomen without guarding or peritoneal signs. Penis and scrotum and testicles appear normal, lungs and heart are clear, no tachycardia, no increased work of breathing, no rales or wheezing. He has no rashes on his legs, he does not appear to have any pressure sores, rectal exam deferred as physician Asst. Jerline Pain his heart he completed this. Please see separate documentation. White blood cell count is significantly elevated at 20,000, hemoglobin close to normal, will need CT scan to rule out intra-abdominal process, rectal temperature pending.    CT shows new onset likely primary colon CA with secondary obstruction  D/w Medicine - Dr. Arnoldo Morale will admit to Med surg bed - will d/w surgery. (Dr. Marcello Moores aware)  Medical screening examination/treatment/procedure(s) were conducted as a shared visit with non-physician practitioner(s) and myself.  I personally evaluated the patient during the encounter.  Clinical Impression: bowel obstruction / Colon CA metastatic      Johnna Acosta, MD 02/02/14 1761  Johnna Acosta, MD 02/02/14 [phone]

## 2014-02-02 NOTE — ED Notes (Signed)
Miller,EDP given CG4 Lactic results.

## 2014-02-02 NOTE — Progress Notes (Signed)
Attempted to call family at number provided, no answer. Pt states he lives with his wife, Archie Patten and son Sandria Senter.

## 2014-02-03 LAB — BASIC METABOLIC PANEL
Anion gap: 14 (ref 5–15)
BUN: 19 mg/dL (ref 6–23)
CHLORIDE: 102 meq/L (ref 96–112)
CO2: 20 mEq/L (ref 19–32)
CREATININE: 0.94 mg/dL (ref 0.50–1.35)
Calcium: 8.4 mg/dL (ref 8.4–10.5)
GFR calc non Af Amer: 72 mL/min — ABNORMAL LOW (ref >90)
GFR, EST AFRICAN AMERICAN: 84 mL/min — AB (ref >90)
Glucose, Bld: 82 mg/dL (ref 70–99)
POTASSIUM: 3.9 meq/L (ref 3.7–5.3)
Sodium: 136 mEq/L — ABNORMAL LOW (ref 137–147)

## 2014-02-03 LAB — HEMOGLOBIN AND HEMATOCRIT, BLOOD
HCT: 33.4 % — ABNORMAL LOW (ref 39.0–52.0)
HEMATOCRIT: 32.5 % — AB (ref 39.0–52.0)
HEMOGLOBIN: 10.8 g/dL — AB (ref 13.0–17.0)
Hemoglobin: 10.8 g/dL — ABNORMAL LOW (ref 13.0–17.0)

## 2014-02-03 MED ORDER — LIP MEDEX EX OINT
TOPICAL_OINTMENT | CUTANEOUS | Status: AC
  Administered 2014-02-03: 12:00:00
  Filled 2014-02-03: qty 7

## 2014-02-03 NOTE — Progress Notes (Signed)
Bowel obstruction  Subjective: Seems about the same  Objective: Vital signs in last 24 hours: Temp:  [97.5 F (36.4 C)-98.4 F (36.9 C)] 98.2 F (36.8 C) (07/19 0627) Pulse Rate:  [77-84] 81 (07/19 0627) Resp:  [12-16] 14 (07/19 0627) BP: (107-147)/(57-75) 143/62 mmHg (07/19 0627) SpO2:  [92 %-96 %] 96 % (07/19 0627) Weight:  [174 lb 2.6 oz (79 kg)] 174 lb 2.6 oz (79 kg) (07/18 1000)    Intake/Output from previous day: 07/18 0701 - 07/19 0700 In: 3068.8 [I.V.:3068.8] Out: 1450 [Urine:850; Emesis/NG output:600] Intake/Output this shift:    General appearance: alert and cooperative GI: normal findings: distended  Lab Results:  Results for orders placed during the hospital encounter of 02/01/14 (from the past 24 hour(s))  HEMOGLOBIN AND HEMATOCRIT, BLOOD     Status: Abnormal   Collection Time    02/02/14  8:05 AM      Result Value Ref Range   Hemoglobin 11.3 (*) 13.0 - 17.0 g/dL   HCT 33.7 (*) 39.0 - 52.0 %  TYPE AND SCREEN     Status: None   Collection Time    02/02/14  8:05 AM      Result Value Ref Range   ABO/RH(D) O POS     Antibody Screen NEG     Sample Expiration 02/05/2014    VITAMIN B12     Status: Abnormal   Collection Time    02/02/14  8:05 AM      Result Value Ref Range   Vitamin B-12 1443 (*) 211 - 911 pg/mL  FOLATE     Status: None   Collection Time    02/02/14  8:05 AM      Result Value Ref Range   Folate >20.0    IRON AND TIBC     Status: Abnormal   Collection Time    02/02/14  8:05 AM      Result Value Ref Range   Iron 24 (*) 42 - 135 ug/dL   TIBC 150 (*) 215 - 435 ug/dL   Saturation Ratios 16 (*) 20 - 55 %   UIBC 126  125 - 400 ug/dL  FERRITIN     Status: Abnormal   Collection Time    02/02/14  8:05 AM      Result Value Ref Range   Ferritin 464 (*) 22 - 322 ng/mL  RETICULOCYTES     Status: Abnormal   Collection Time    02/02/14  8:05 AM      Result Value Ref Range   Retic Ct Pct 1.5  0.4 - 3.1 %   RBC. 4.01 (*) 4.22 - 5.81 MIL/uL   Retic Count, Manual 60.2  19.0 - 186.0 K/uL  HEMOGLOBIN AND HEMATOCRIT, BLOOD     Status: Abnormal   Collection Time    02/02/14  3:59 PM      Result Value Ref Range   Hemoglobin 11.1 (*) 13.0 - 17.0 g/dL   HCT 33.0 (*) 39.0 - 52.0 %  HEMOGLOBIN AND HEMATOCRIT, BLOOD     Status: Abnormal   Collection Time    02/02/14 11:30 PM      Result Value Ref Range   Hemoglobin 11.0 (*) 13.0 - 17.0 g/dL   HCT 32.7 (*) 39.0 - 52.0 %     Studies/Results Radiology     MED[facility], Scheduled . antiseptic oral rinse  15 mL Mouth Rinse q12n4p  . chlorhexidine  15 mL Mouth Rinse BID  . pantoprazole (PROTONIX) IV  40  mg Intravenous Q12H     Assessment: Bowel obstruction with impending bowel perforation Pt made DNR  Plan: Awaiting family's decision for treatment.  Surgery would entail diverting loop ostomy most likely.  Prognosis would depend on possibility of palliative chemo.  LOS: 2 days    Rosario Adie, [facility], [place] [phone]   02/03/2014 7:26 AM

## 2014-02-03 NOTE — ED Provider Notes (Signed)
Medical screening examination/treatment/procedure(s) were conducted as a shared visit with non-physician practitioner(s) and myself.  I personally evaluated the patient during the encounter  Please see my separate respective documentation pertaining to this patient encounter   Johnna Acosta, MD 02/03/14 [phone]

## 2014-02-03 NOTE — Progress Notes (Signed)
INITIAL NUTRITION ASSESSMENT  DOCUMENTATION CODES Per approved criteria  -Not Applicable   INTERVENTION: - If pt is unable to tolerate po, and aggressive nutrition therapy is warranted, recommend TPN per pharm. - RD will continue to monitor for nutrition care plan.  NUTRITION DIAGNOSIS: Inadequate oral intake related to bowel obstruction secondary to malignancy as evidenced by NPO.   Goal: Pt to meet >/= 90% of their estimated nutrition needs   Monitor:  Wt trends, po intake, goals of care, need for nutrition support, labs  Reason for Assessment: MST  80 y.o. male  Admitting Dx: Bowel obstruction  ASSESSMENT: 80 y.o. male with a history of Dementia, HTN, Hyperlipidemia and remote history of Prostate Cancer, and skin Cancer who was brought to the Ed by his family due to 4 episodes of N+V During the day. The family also reported that the patient has been having tarry stools for the past 3 months. The patient has Dementia and is not able to give his medical history.  - Comfort care vs surgery. Palliative care meeting Monday. - Pt with no family in room to give nutritional history. - NG tube in place.  Height: Ht Readings from Last 1 Encounters:  02/02/14 6' (1.829 m)    Weight: Wt Readings from Last 1 Encounters:  02/02/14 174 lb 2.6 oz (79 kg)    Ideal Body Weight: 77.6 kg  % Ideal Body Weight: 102%  Wt Readings from Last 10 Encounters:  02/02/14 174 lb 2.6 oz (79 kg)  12/14/13 175 lb (79.379 kg)    Usual Body Weight: unknown, no significant change per history in chart  % Usual Body Weight: unknown  BMI:  Body mass index is 23.62 kg/(m^2).  Estimated Nutritional Needs: Kcal: 2000-2200 Protein: 95-105 g Fluid: ~2.0 L/day  Skin: intact  Diet Order: NPO  EDUCATION NEEDS: -Education not appropriate at this time   Intake/Output Summary (Last 24 hours) at 02/03/14 1006 Last data filed at 02/03/14 0800  Gross per 24 hour  Intake   2500 ml  Output    1275 ml  Net   1225 ml    Last BM: 7/19   Labs:   Recent Labs Lab 02/01/14 2213 02/02/14 0543 02/03/14 0756  NA 135* 135* 136*  K 4.3 4.3 3.9  CL 95* 97 102  CO2 23 24 20   BUN 22 21 19   CREATININE 1.08 1.06 0.94  CALCIUM 9.6 9.0 8.4  GLUCOSE 122* 116* 82    CBG (last 3)  No results found for this basename: GLUCAP,  in the last 72 hours  Scheduled Meds: . antiseptic oral rinse  15 mL Mouth Rinse q12n4p  . chlorhexidine  15 mL Mouth Rinse BID  . pantoprazole (PROTONIX) IV  40 mg Intravenous Q12H    Continuous Infusions: . sodium chloride 125 mL/hr at 02/03/14 0534    Past Medical History  Diagnosis Date  . Dementia   . Hypertension   . High cholesterol   . Anemia   . Cancer     skin cancer, prostate cancer    History reviewed. No pertinent past surgical history.  Terrace Arabia RD, LDN

## 2014-02-03 NOTE — Progress Notes (Signed)
[facility] PROGRESS NOTE  Nathan Glenn LKT:[id number] DOB: 06-May-1924 DOA: 02/01/2014 PCP: Jerlyn Ly, MD  Assessment/Plan: 1. Bowel Obstruction Secondary to Malignancy -Patient presenting with several episodes of nausea and vomiting. -CT scan of ABD/Pelvis down overnight showed irregular thickening involving the hepatic flexure measuring at least 10 cm in length most concerning for primary colonic malignancy.  -Dr Marcello Moores of General Surgery consulted.  -NT tube to be placed by IR -I spoke with [name] over telephone regarding goals of care and treatment options. She would like to discuss this further with her son.  -Continue IV fluids/supportive care -No changes   2. Intractable Nausea/Vomiting -Secondary to bowel obstruction from underlying malignancy -IR placed NG tube  3. Advanced Dementia -Stable  4. Goals of Care. Patient made a DNR yesterday. Palliative care consulted to discuss goals of care. Family to decide if they wish to pursue surgical intervention.      Code Status: Full Code Family Communication: Spoke with [name] over telephone Disposition Plan: Will hold family meeting to discuss goals of care and treatment options.   Consultants:  General surgery  HPI/Subjective: Nathan Glenn is a 80 y.o. male with a history of Dementia, HTN, Hyperlipidemia and remote history of Prostate Cancer, and skin Cancer who was brought to the emergency department at [facility] Family calling EMS after patient had 4 episodes of nonbloody emesis. Family also reported black tarry stools. A CT scan of ABD/Pelvis  showed irregular thickening involving the hepatic flexure measuring at least 10 cm in length most concerning for primary colonic malignancy. There are multiple adjacent lymph nodes along the mesenteric aspect of the mass measuring up to 13 mm concerning for local nodal metastasis. The superior margin of the mass abuts the inferior margin of  the liver with irregularity at the interface concerning for direct hepatic invasion. There are multiple other hypodense hepatic masses most consistent with metastatic disease. Dr Marcello Moores of General Surgery Consulted.      Objective: Filed Vitals:   02/03/14 1446  BP: 139/71  Pulse: 81  Temp: 98 F (36.7 C)  Resp: 16    Intake/Output Summary (Last 24 hours) at 02/03/14 1552 Last data filed at 02/03/14 1015  Gross per 24 hour  Intake   2000 ml  Output   1300 ml  Net    700 ml   Filed Weights   02/02/14 1000  Weight: 79 kg (174 lb 2.6 oz)    Exam:   General:  Elderly male, demented, no distress  Cardiovascular: Regular rate and rhythm normal S1S2, no edema  Respiratory: Clear to auscultation  Abdomen: Abdomen is tender to palpation across all 4 quadrants. There was no rebound tenderness of peritoneal signs.   Musculoskeletal: no edema  Data Reviewed: Basic Metabolic Panel:  Recent Labs Lab 02/01/14 2213 02/02/14 0543 02/03/14 0756  NA 135* 135* 136*  K 4.3 4.3 3.9  CL 95* 97 102  CO2 23 24 20   GLUCOSE 122* 116* 82  BUN 22 21 19   CREATININE 1.08 1.06 0.94  CALCIUM 9.6 9.0 8.4   Liver Function Tests:  Recent Labs Lab 02/01/14 2213  AST 27  ALT 20  ALKPHOS 242*  BILITOT 0.4  PROT 7.2  ALBUMIN 2.4*    Recent Labs Lab 02/01/14 2213  LIPASE 15   No results found for this basename: AMMONIA,  in the last 168 hours CBC:  Recent Labs Lab 02/01/14 2213 02/02/14 0543 02/02/14 0805 02/02/14 1559 02/02/14  2330 02/03/14 0756  WBC 20.1* 21.2*  --   --   --   --   NEUTROABS 16.8*  --   --   --   --   --   HGB 11.6* 10.6* 11.3* 11.1* 11.0* 10.8*  HCT 35.5* 33.2* 33.7* 33.0* 32.7* 32.5*  MCV 85.7 86.7  --   --   --   --   PLT 503* 476*  --   --   --   --    Cardiac Enzymes: No results found for this basename: CKTOTAL, CKMB, CKMBINDEX, TROPONINI,  in the last 168 hours BNP (last 3 results) No results found for this basename: PROBNP,  in the last  8760 hours CBG: No results found for this basename: GLUCAP,  in the last 168 hours  No results found for this or any previous visit (from the past 240 hour(s)).   Studies: Ct Abdomen Pelvis W Contrast  02/02/2014   CLINICAL DATA:  Emesis and tori stools  EXAM: CT ABDOMEN AND PELVIS WITH CONTRAST  TECHNIQUE: Multidetector CT imaging of the abdomen and pelvis was performed using the standard protocol following bolus administration of intravenous contrast.  CONTRAST:  63mL OMNIPAQUE IOHEXOL 300 MG/ML SOLN, 151mL OMNIPAQUE IOHEXOL 300 MG/ML SOLN  COMPARISON:  None.  FINDINGS: The lung bases are clear.  There is no intrahepatic or extrahepatic biliary ductal dilatation. The gallbladder is normal. The spleen demonstrates no focal abnormality.There is a horseshoe kidney. The adrenal glands and pancreas are normal. The bladder is unremarkable.  There is irregular wall thickening involving the hepatic flexure measuring at least 10 cm in length most concerning for malignancy. There are multiple adjacent lymph nodes along the mesenteric aspect of the mass measuring up to 13 mm. The superior margin of the mass abuts the inferior margin of the liver with possible direct invasion. There are multiple hypodense hepatic masses with the largest measuring 16 mm along the inferior right hepatic lobe most concerning for metastatic disease. There is portacaval lymphadenopathy with the largest lymph node measuring 4.6 x 3.3 cm just anterior to the vena cava.  The hepatic flexure mass is resulting in colonic obstruction with severe dilatation of the ascending colon measuring up to 10 cm. There is also small bowel dilatation.  There is diverticulosis without evidence of diverticulitis. There is no pneumoperitoneum, pneumatosis, or portal venous gas. There is no abdominal or pelvic free fluid.  The abdominal aorta is normal in caliber with atherosclerosis.  There are no lytic or sclerotic osseous lesions. There is lumbar spine  spondylosis most severe at L2-3 and L5-S1.  IMPRESSION: 1. Irregular thickening involving the hepatic flexure measuring at least 10 cm in length most concerning for primary colonic malignancy. There are multiple adjacent lymph nodes along the mesenteric aspect of the mass measuring up to 13 mm concerning for local nodal metastasis. The superior margin of the mass abuts the inferior margin of the liver with irregularity at the interface concerning for direct hepatic invasion. There are multiple other hypodense hepatic masses most consistent with metastatic disease. 2. There is bowel obstruction resulting from the hepatic flexure colonic mass.   Electronically Signed   By: Kathreen Devoid   On: 02/02/2014 01:25   Dg Addison Bailey G Tube Plc W/fl W/rad  02/02/2014   CLINICAL DATA:  Needs nasogastric tube.  Dementia.  Colon mass.  EXAM: NASO G TUBE PLACEMENT WITH FL AND WITH RAD  : CONTRAST:  8mL OMNIPAQUE IOHEXOL 300 MG/ML  SOLN  FLUOROSCOPY  TIME:  3 min in 18 seconds  COMPARISON:  CT of 02/02/2014  FINDINGS: Nasogastric tube displaced to the level of the gastric body. Location is verified using 20 cc of Omnipaque 300.  IMPRESSION: Successful placement of a nasogastric tube into the gastric body.   Electronically Signed   By: Abigail Miyamoto M.D.   On: 02/02/2014 15:22    Scheduled Meds: . antiseptic oral rinse  15 mL Mouth Rinse q12n4p  . chlorhexidine  15 mL Mouth Rinse BID  . pantoprazole (PROTONIX) IV  40 mg Intravenous Q12H   Continuous Infusions: . sodium chloride 125 mL/hr at 02/03/14 0534    Principal Problem:   Bowel obstruction Active Problems:   Liver masses   Nausea and vomiting   Heme positive stool   Anemia   Leukocytosis   Thrombocytosis   Benign essential hypertension   Hyperlipidemia   Dementia   Malignant neoplasm of ascending colon    Time spent: 25 min    Kelvin Cellar  [facility] Pager [phone]. If 7PM-7AM, please contact night-coverage at [email or web address], password  [facility] 02/03/2014, 3:52 PM  LOS: 2 days

## 2014-02-04 DIAGNOSIS — Z515 Encounter for palliative care: Secondary | ICD-10-CM

## 2014-02-04 DIAGNOSIS — K56609 Unspecified intestinal obstruction, unspecified as to partial versus complete obstruction: Secondary | ICD-10-CM

## 2014-02-04 DIAGNOSIS — C182 Malignant neoplasm of ascending colon: Principal | ICD-10-CM

## 2014-02-04 LAB — CBC
HEMATOCRIT: 31.9 % — AB (ref 39.0–52.0)
Hemoglobin: 10.4 g/dL — ABNORMAL LOW (ref 13.0–17.0)
MCH: 28.2 pg (ref 26.0–34.0)
MCHC: 32.6 g/dL (ref 30.0–36.0)
MCV: 86.4 fL (ref 78.0–100.0)
Platelets: 430 10*3/uL — ABNORMAL HIGH (ref 150–400)
RBC: 3.69 MIL/uL — ABNORMAL LOW (ref 4.22–5.81)
RDW: 15.5 % (ref 11.5–15.5)
WBC: 15.3 10*3/uL — AB (ref 4.0–10.5)

## 2014-02-04 LAB — BASIC METABOLIC PANEL
Anion gap: 16 — ABNORMAL HIGH (ref 5–15)
BUN: 18 mg/dL (ref 6–23)
CHLORIDE: 107 meq/L (ref 96–112)
CO2: 17 mEq/L — ABNORMAL LOW (ref 19–32)
CREATININE: 1.02 mg/dL (ref 0.50–1.35)
Calcium: 8.2 mg/dL — ABNORMAL LOW (ref 8.4–10.5)
GFR calc non Af Amer: 63 mL/min — ABNORMAL LOW (ref >90)
GFR, EST AFRICAN AMERICAN: 74 mL/min — AB (ref >90)
Glucose, Bld: 73 mg/dL (ref 70–99)
Potassium: 3.9 mEq/L (ref 3.7–5.3)
Sodium: 140 mEq/L (ref 137–147)

## 2014-02-04 LAB — HEMOGLOBIN AND HEMATOCRIT, BLOOD
HCT: 31.3 % — ABNORMAL LOW (ref 39.0–52.0)
Hemoglobin: 10.2 g/dL — ABNORMAL LOW (ref 13.0–17.0)

## 2014-02-04 NOTE — Consult Note (Signed)
Patient Nathan Glenn      DOB: 1924-01-13      IWL:[id number]     Consult Note from the Palliative Medicine Team at [place] Requested by: Dr. Coralyn Pear     PCP: Jerlyn Ly, MD Reason for Consultation: [place]    Phone Number:[phone]  Assessment of patients Current state: 80 yr old white male with a known advanced dementia had been living at home until this admission.  Patient had developed signs and symptoms of possible colon issue in the last few months (+FOBT, iron def etc) that Dr. Joylene Draft had been following and working with the family on. Goals had been not to put him through significant workup since aggressive interventions would not be sought.  Patient developed nausea and vomiting at home and was admitted.  It was discovered that patient has a near obstructing mass with possible imminent perforation. NGT was placed for symptom management and conservative watchful waiting was employed.  Family was updated on mass. Patient was felt to not be a surgical candidate which family supported and reiterated to me today. They had a family member who had the same condition and he had post op complications which resulted in wound healing by secondary intention. Wound needed to be pack and he died shortly after.  Carole and Gene do not want to put Tommy through that.  We talked about comfort care and not being able to eat.  We talked about using hospice facility which they were in favor of.  Family concurs with full comfort and would like to transition to hospice facility.  Patient may require PICC to permit reliable symptom management which will need to be aggressive if/when he perforates.      Goals of Care: 1.  Code Status: DNR   2. Scope of Treatment: Symptom management may need NGT for longer to promote comfort.  He is blissfully unaware of the tube and has manly been sleeping .  He has not needed a lot of pain meds but will likely need them soon.  4. Disposition: Referral  for hospice facility   3. Symptom Management:   1. Anxiety/Agitation: not an issue at present . For future can use haldol and ativan as needed depending on his symptoms. 2. Pain: Patient has access to dilaudid IV but has not really needed anything while NPO with NGT in place.  Might consider PICC for continuous opiates  Which will likely be needed 3. Bowel Regimen: Monitor. Family reports some recent movements. 4. Delirium: Not an issue at present 5. Fever: Tylenol suppository prn  6. Nausea/Vomiting: prn meds IV for now, could use compazine suppository  4. Psychosocial: married 17 yrs , one son.  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  5. Spiritual: "He is good with the Lord". Attends the [employer]        Patient Documents Completed or Given: Document Given Completed  Advanced Directives Pkt    MOST    DNR    Gone from My Sight    Hard Choices      Brief HPI:  80 yr old admitted with NV found to have near obstructing colon mass. Asked to assist with GOC   ROS: Pleasantly demented can't fully participate.    PMH:  Past Medical History  Diagnosis Date  . Dementia   . Hypertension   . High cholesterol   .  Anemia   . Cancer     skin cancer, prostate cancer     OXB:DZHGDJM reviewed. No pertinent past surgical history. I have reviewed the [place] and SH and  If appropriate update it with new information. No Known Allergies Scheduled Meds: . antiseptic oral rinse  15 mL Mouth Rinse q12n4p  . chlorhexidine  15 mL Mouth Rinse BID  . pantoprazole (PROTONIX) IV  40 mg Intravenous Q12H   Continuous Infusions: . sodium chloride 125 mL/hr at 02/04/14 0508   PRN Meds:.acetaminophen, acetaminophen, HYDROmorphone (DILAUDID) injection, ondansetron (ZOFRAN) IV, ondansetron, oxyCODONE    BP 155/49  Pulse 69  Temp(Src) 98.3 F (36.8 C) (Oral)  Resp 18  Ht  6' (1.829 m)  Wt 79 kg (174 lb 2.6 oz)  BMI 23.62 kg/m2  SpO2 95%   PPS: 30%   Intake/Output Summary (Last 24 hours) at 02/04/14 1546 Last data filed at 02/04/14 1400  Gross per 24 hour  Intake   2000 ml  Output   2275 ml  Net   -275 ml   LBM: 7/18  Physical Exam:  General: No acute distress pleasantly demented multiple actinic keritosis HEENT:  PERRL, EOMI, anicteric, mmm dry , ngt in nare, draing yellow liquid Chest:   Decreased but clear  CVS: regular , S1, S2 Abdomen: no grimace on palpation, firm, not distended, Ext:  Multiple areas of ecchymosis Neuro: Pleasantly demented, speech clear  Labs: CBC    Component Value Date/Time   WBC 15.3* 02/04/2014 0503   RBC 3.69* 02/04/2014 0503   RBC 4.01* 02/02/2014 0805   HGB 10.4* 02/04/2014 0503   HCT 31.9* 02/04/2014 0503   PLT 430* 02/04/2014 0503   MCV 86.4 02/04/2014 0503   MCH 28.2 02/04/2014 0503   MCHC 32.6 02/04/2014 0503   RDW 15.5 02/04/2014 0503   LYMPHSABS 1.6 02/01/2014 2213   MONOABS 1.6* 02/01/2014 2213   EOSABS 0.0 02/01/2014 2213   BASOSABS 0.0 02/01/2014 2213     CMP     Component Value Date/Time   NA 140 02/04/2014 0503   K 3.9 02/04/2014 0503   CL 107 02/04/2014 0503   CO2 17* 02/04/2014 0503   GLUCOSE 73 02/04/2014 0503   BUN 18 02/04/2014 0503   CREATININE 1.02 02/04/2014 0503   CALCIUM 8.2* 02/04/2014 0503   PROT 7.2 02/01/2014 2213   ALBUMIN 2.4* 02/01/2014 2213   AST 27 02/01/2014 2213   ALT 20 02/01/2014 2213   ALKPHOS 242* 02/01/2014 2213   BILITOT 0.4 02/01/2014 2213   GFRNONAA 63* 02/04/2014 0503   GFRAA 74* 02/04/2014 0503    Chest Xray Reviewed/Impressions: Mild increased pulmonary interstitium which can be seen in mild  interstitial edema versus viral infection   CT scan of the Head Reviewed/Impressions:No evidence of acute intracranial abnormality  CT Abdomen/Pelvis: 1. Irregular thickening involving the hepatic flexure measuring at  least 10 cm in length most concerning for primary colonic   malignancy. There are multiple adjacent lymph nodes along the  mesenteric aspect of the mass measuring up to 13 mm concerning for  local nodal metastasis. The superior margin of the mass abuts the  inferior margin of the liver with irregularity at the interface  concerning for direct hepatic invasion. There are multiple other  hypodense hepatic masses most consistent with metastatic disease.  2. There is bowel obstruction resulting from the hepatic flexure  colonic mass.      Time In Time Out Total Time Spent with Patient Total Overall Time  200 pm 345 pm 20 min 95 min    Greater than 50%  of this time was spent counseling and coordinating care related to the above assessment and plan.  Dannette Kinkaid L. Lovena Le, MD MBA The Palliative Medicine Team at [facility] Phone: [phone] Pager: [phone]( use team phone after hours)

## 2014-02-04 NOTE — Progress Notes (Signed)
Clinical Social Work Department BRIEF PSYCHOSOCIAL ASSESSMENT 02/04/2014  Patient:  Nathan Glenn, Nathan Glenn     Account Number:  [id number]     Admit date:  02/01/2014  Clinical Social Worker:  Ulyess Blossom  Date/Time:  02/04/2014 03:00 PM  Referred by:  Physician  Date Referred:  02/04/2014 Referred for  Residential hospice placement   Other Referral:   Interview type:  Patient Other interview type:   and patient spouse at bedside    PSYCHOSOCIAL DATA Living Status:  WIFE Admitted from facility:   Level of care:   Primary support name:  Carole Garland/wife/[phone] Primary support relationship to patient:  SPOUSE Degree of support available:   strong    CURRENT CONCERNS Current Concerns  Post-Acute Placement   Other Concerns:    SOCIAL WORK ASSESSMENT / PLAN CSW received referral for Residential Hospice Placement. CSW spoke with PMT MD, Dr. Lovena Le who had just met with pt wife and pt son regarding goals of care and pt family agreeable to residential hospice placement. Dr. Lovena Le stated that pt family can be difficult to reach via telephone and ask if CSW can go to pt room as pt wife is still present at bedside.    CSW visited pt and pt wife at bedside. CSW introduced self and explained role. Pt wife shared that pt son had gone to get lunch for pt wife and pt son. CSW expressed understanding and discussed that Dr. Lovena Le asked CSW to offer choice for residential hospice. Dr. Lovena Le entered room at that time and assisted in discussing with pt wife. Pt wife chooses [facility]. CSW provided CSW contact information to pt wife in order for pt wife to provide to pt son if pt son had any questions.    CSW contacted [employer], Erling Conte and made referral.    CSW to continue to follow to assist with residential hospice placement.   Assessment/plan status:  Psychosocial Support/Ongoing Assessment of Needs Other assessment/ plan:   discharge planning    Information/referral to community resources:   Residential Hospice List    PATIENT'S/FAMILY'S RESPONSE TO PLAN OF CARE: Pt alert and oriented to person only. Pt wife pleasant and agreeable to referral to [facility] in order to gain further information on availability at [facility]. Pt wife aware to notify pt son to contact CSW if pt son has any questions.    Alison Murray, MSW, LCSW Clinical Social Work Coverage for [employer], [place]

## 2014-02-04 NOTE — Progress Notes (Signed)
[facility] PROGRESS NOTE  Nathan Glenn JSH:[id number] DOB: 06-Nov-1923 DOA: 02/01/2014 PCP: Jerlyn Ly, MD  Assessment/Plan: 1. Bowel Obstruction Secondary to Malignancy -Patient presenting with several episodes of nausea and vomiting. -CT scan of ABD/Pelvis showed irregular thickening involving the hepatic flexure measuring at least 10 cm in length most concerning for primary colonic malignancy.  -General Surgery consulted.  -NT tube to be placed by IR -I spoke with [name] and son regarding goals of care and treatment options, as they seemed inclined to pursue comfort. Palliative Care consulted for assistance with defining goals of care.   -Continue IV fluids/supportive care -No changes today  2. Intractable Nausea/Vomiting -Secondary to bowel obstruction from underlying malignancy -IR placed NG tube  3. Advanced Dementia -Stable  4. Goals of Care. Patient made a DNR after family discussion. Palliative care consulted to discuss goals of care. Family to decide if they wish to pursue surgical intervention although based on our conversation it appears they were leaning towards comfort.       Code Status: Full Code Family Communication:  Disposition Plan: Will hold family meeting to discuss goals of care and treatment options.   Consultants:  General surgery  HPI/Subjective: Nathan Glenn is a 80 y.o. male with a history of Dementia, HTN, Hyperlipidemia and remote history of Prostate Cancer, and skin Cancer who was brought to the emergency department at [facility]. Family calling EMS after patient had 4 episodes of nonbloody emesis. Family also reported black tarry stools. A CT scan of ABD/Pelvis  showed irregular thickening involving the hepatic flexure measuring at least 10 cm in length most concerning for primary colonic malignancy. There are multiple adjacent lymph nodes along the mesenteric aspect of the mass measuring up to 13 mm concerning for  local nodal metastasis. The superior margin of the mass abuts the inferior margin of the liver with irregularity at the interface concerning for direct hepatic invasion. There are multiple other hypodense hepatic masses most consistent with metastatic disease. Dr Marcello Moores of General Surgery Consulted.   Patient's exam essentially unchanged. He is pleasantly confused and disoriented   Objective: Filed Vitals:   02/04/14 1000  BP: 135/56  Pulse: 73  Temp: 97.7 F (36.5 C)  Resp: 18    Intake/Output Summary (Last 24 hours) at 02/04/14 1039 Last data filed at 02/04/14 1000  Gross per 24 hour  Intake 2468.75 ml  Output   2325 ml  Net 143.75 ml   Filed Weights   02/02/14 1000  Weight: 79 kg (174 lb 2.6 oz)    Exam:   General:  Elderly male, demented, no distress  Cardiovascular: Regular rate and rhythm normal S1S2, no edema  Respiratory: Clear to auscultation  Abdomen: Improved abdominal exam. There was no rebound tenderness of peritoneal signs.   Musculoskeletal: no edema  Data Reviewed: Basic Metabolic Panel:  Recent Labs Lab 02/01/14 2213 02/02/14 0543 02/03/14 0756 02/04/14 0503  NA 135* 135* 136* 140  K 4.3 4.3 3.9 3.9  CL 95* 97 102 107  CO2 23 24 20  17*  GLUCOSE 122* 116* 82 73  BUN 22 21 19 18   CREATININE 1.08 1.06 0.94 1.02  CALCIUM 9.6 9.0 8.4 8.2*   Liver Function Tests:  Recent Labs Lab 02/01/14 2213  AST 27  ALT 20  ALKPHOS 242*  BILITOT 0.4  PROT 7.2  ALBUMIN 2.4*    Recent Labs Lab 02/01/14 2213  LIPASE 15   No results found for this basename: AMMONIA,  in the last 168 hours CBC:  Recent Labs Lab 02/01/14 2213 02/02/14 0543  02/02/14 2330 02/03/14 0756 02/03/14 1549 02/03/14 2350 02/04/14 0503  WBC 20.1* 21.2*  --   --   --   --   --  15.3*  NEUTROABS 16.8*  --   --   --   --   --   --   --   HGB 11.6* 10.6*  < > 11.0* 10.8* 10.8* 10.2* 10.4*  HCT 35.5* 33.2*  < > 32.7* 32.5* 33.4* 31.3* 31.9*  MCV 85.7 86.7  --   --    --   --   --  86.4  PLT 503* 476*  --   --   --   --   --  430*  < > = values in this interval not displayed. Cardiac Enzymes: No results found for this basename: CKTOTAL, CKMB, CKMBINDEX, TROPONINI,  in the last 168 hours BNP (last 3 results) No results found for this basename: PROBNP,  in the last 8760 hours CBG: No results found for this basename: GLUCAP,  in the last 168 hours  No results found for this or any previous visit (from the past 240 hour(s)).   Studies: Dg Loyce Dys Tube Plc W/fl W/rad  02/02/2014   CLINICAL DATA:  Needs nasogastric tube.  Dementia.  Colon mass.  EXAM: NASO G TUBE PLACEMENT WITH FL AND WITH RAD  : CONTRAST:  13mL OMNIPAQUE IOHEXOL 300 MG/ML  SOLN  FLUOROSCOPY TIME:  3 min in 18 seconds  COMPARISON:  CT of 02/02/2014  FINDINGS: Nasogastric tube displaced to the level of the gastric body. Location is verified using 20 cc of Omnipaque 300.  IMPRESSION: Successful placement of a nasogastric tube into the gastric body.   Electronically Signed   By: Abigail Miyamoto M.D.   On: 02/02/2014 15:22    Scheduled Meds: . antiseptic oral rinse  15 mL Mouth Rinse q12n4p  . chlorhexidine  15 mL Mouth Rinse BID  . pantoprazole (PROTONIX) IV  40 mg Intravenous Q12H   Continuous Infusions: . sodium chloride 125 mL/hr at 02/04/14 2336    Principal Problem:   Bowel obstruction Active Problems:   Liver masses   Nausea and vomiting   Heme positive stool   Anemia   Leukocytosis   Thrombocytosis   Benign essential hypertension   Hyperlipidemia   Dementia   Malignant neoplasm of ascending colon    Time spent: 25 min    Kelvin Cellar  [facility] Pager [phone]. If 7PM-7AM, please contact night-coverage at [email or web address], password [facility] 02/04/2014, 10:39 AM  LOS: 3 days

## 2014-02-04 NOTE — Progress Notes (Signed)
Patient ID: Nathan Glenn, male   DOB: 08-14-1923, 80 y.o.   MRN: [id number]    Subjective: The patient is unsure why he is in the hospital. He does note that he is in the hospital though.  Objective: Vital signs in last 24 hours: Temp:  [97.7 F (36.5 C)-99.2 F (37.3 C)] 97.7 F (36.5 C) (07/20 1000) Pulse Rate:  [73-81] 73 (07/20 1000) Resp:  [16-20] 18 (07/20 1000) BP: (135-143)/(56-72) 135/56 mmHg (07/20 1000) SpO2:  [94 %-97 %] 94 % (07/20 1000)    Intake/Output from previous day: 07/19 0701 - 07/20 0700 In: 3000 [I.V.:3000] Out: 2100 [Urine:1050; Emesis/NG output:1050] Intake/Output this shift: Total I/O In: 0  Out: 300 [Urine:300]  PE: Abd: Soft, no peritoneal signs or voluntary guarding. Few bowel sounds. NG tube with bilious output in canister.  Lab Results:   Recent Labs  02/02/14 0543  02/03/14 2350 02/04/14 0503  WBC 21.2*  --   --  15.3*  HGB 10.6*  < > 10.2* 10.4*  HCT 33.2*  < > 31.3* 31.9*  PLT 476*  --   --  430*  < > = values in this interval not displayed. BMET  Recent Labs  02/03/14 0756 02/04/14 0503  NA 136* 140  K 3.9 3.9  CL 102 107  CO2 20 17*  GLUCOSE 82 73  BUN 19 18  CREATININE 0.94 1.02  CALCIUM 8.4 8.2*   PT/INR No results found for this basename: LABPROT, INR,  in the last 72 hours CMP     Component Value Date/Time   NA 140 02/04/2014 0503   K 3.9 02/04/2014 0503   CL 107 02/04/2014 0503   CO2 17* 02/04/2014 0503   GLUCOSE 73 02/04/2014 0503   BUN 18 02/04/2014 0503   CREATININE 1.02 02/04/2014 0503   CALCIUM 8.2* 02/04/2014 0503   PROT 7.2 02/01/2014 2213   ALBUMIN 2.4* 02/01/2014 2213   AST 27 02/01/2014 2213   ALT 20 02/01/2014 2213   ALKPHOS 242* 02/01/2014 2213   BILITOT 0.4 02/01/2014 2213   GFRNONAA 63* 02/04/2014 0503   GFRAA 74* 02/04/2014 0503   Lipase     Component Value Date/Time   LIPASE 15 02/01/2014 2213       Studies/Results: Dg Naso G Tube Plc W/fl W/rad  02/02/2014   CLINICAL DATA:  Needs  nasogastric tube.  Dementia.  Colon mass.  EXAM: NASO G TUBE PLACEMENT WITH FL AND WITH RAD  : CONTRAST:  47mL OMNIPAQUE IOHEXOL 300 MG/ML  SOLN  FLUOROSCOPY TIME:  3 min in 18 seconds  COMPARISON:  CT of 02/02/2014  FINDINGS: Nasogastric tube displaced to the level of the gastric body. Location is verified using 20 cc of Omnipaque 300.  IMPRESSION: Successful placement of a nasogastric tube into the gastric body.   Electronically Signed   By: Abigail Miyamoto M.D.   On: 02/02/2014 15:22    Anti-infectives: Anti-infectives   None       Assessment/Plan  1. Malignant bowel obstruction from Mass hepatic flexure 2. Advanced dementia  Plan: 1. Given the patient's other medical problems including his advanced dementia, we agree with pursuing a palliative care goals of care meeting to determine whether the family who would like to be aggressive with the patient or if they would like to go ahead and pursue palliative measures. 2. Given his advanced dementia and his current baseline which is not great, I'm not sure if the patient were to have a major abdominal operation  that his baseline after surgery would be any improvement to what it is now. 3. Continuous NG tube management and IV fluids until we can contact to the wife for further direction of care  LOS: 3 days    Tomara Youngberg E 02/04/2014, 10:19 AM Pager: [phone]

## 2014-02-04 NOTE — Progress Notes (Signed)
Patient PP:JKDTOI E Slappey      DOB: August 19, 1923      ZTI:[id number]  Checked on patient resting comfortably with NGT in place. No family at the bedside.  Called house number .  No answer from spouse.  Left my card with nursing to update her on my coming. Will check back after 11 am today.    Else Habermann L. Lovena Le, MD MBA The Palliative Medicine Team at [facility] Phone: [phone] Pager: [phone]

## 2014-02-05 DIAGNOSIS — C787 Secondary malignant neoplasm of liver and intrahepatic bile duct: Secondary | ICD-10-CM

## 2014-02-05 MED ORDER — CHLORHEXIDINE GLUCONATE 0.12 % MT SOLN: 15.0000 mL | Freq: Two times a day (BID) | OROMUCOSAL | Status: AC

## 2014-02-05 MED ORDER — HYDROMORPHONE HCL PF 1 MG/ML IJ SOLN: 1.0000 mg | INTRAMUSCULAR | Status: AC | PRN

## 2014-02-05 NOTE — Discharge Summary (Signed)
Physician Discharge Summary  [name] CHY:[id number] DOB: 12-27-1923 DOA: 02/01/2014  PCP: Jerlyn Ly, MD  Admit date: 02/01/2014 Discharge date: 02/05/2014  Time spent: 35 minutes  Recommendations for Outpatient Follow-up:  1. Patient transitioned to full comfort measures.   Discharge Diagnoses:  Principal Problem:   Bowel obstruction Active Problems:   Liver masses   Nausea and vomiting   Heme positive stool   Anemia   Leukocytosis   Thrombocytosis   Benign essential hypertension   Hyperlipidemia   Dementia   Malignant neoplasm of ascending colon   Discharge Condition: Stable  Diet recommendation: NPO  Filed Weights   02/02/14 1000  Weight: 79 kg (174 lb 2.6 oz)    History of present illness:  Nathan Glenn is a 80 y.o. male with a history of Dementia, HTN, Hyperlipidemia and remote history of Prostate Cancer, and skin Cancer who was brought to the Ed by his family due to 4 episodes of N+V During the day. The family also reported that the patient has been having tarry stools for the past 3 months. The patient has Dementia and is not able to give his medical history. The family spoke to the EDP and left and were unable to be reached by telephone afterward. The patient denies any pain currently. Patient was sent for a CT scan of the ABD which returned revealing an Obstructing Colonic Mass at the Hepatic Flexure along with multiple hyperechoic lesions in the Liver. The EDP placed an NGT and the patient was referred for medical admission. A General Surgery consultation was also placed.   Hospital Course:  Nathan Glenn is a 80 y.o. male with a history of Dementia, HTN, Hyperlipidemia and remote history of Prostate Cancer, and skin Cancer who was brought to the emergency department at [facility]. Family calling EMS after patient had 4 episodes of nonbloody emesis. Family also reported black tarry stools. A CT scan of ABD/Pelvis showed irregular  thickening involving the hepatic flexure measuring at least 10 cm in length most concerning for primary colonic malignancy. There are multiple adjacent lymph nodes along the mesenteric aspect of the mass measuring up to 13 mm concerning for local nodal metastasis. The superior margin of the mass abuts the inferior margin of the liver with irregularity at the interface concerning for direct hepatic invasion. There are multiple other hypodense hepatic masses most consistent with metastatic disease. Dr Marcello Moores of General Surgery Consulted. Treatment options were discussed with his wife and son as they preferred to focus his care on comfort rather than pursuing aggressive/invasive intervention. I discussed this with General Surgery as they felt this was an appropriate decision. Palliative care consulted, patient transitioned to full comfort. A PICC line was placed on 02/05/2014. Plan to discharge to Inpatient Hospice on 02/06/2014.     Procedures:  PICC Line placement on 02/05/2014  NG tube placement by interventional radiology   Consultations:  General Surgery  Palliative Care  Discharge Exam: Filed Vitals:   02/05/14 1304  BP: 133/62  Pulse: 75  Temp: 98.5 F (36.9 C)  Resp: 18      Discharge Instructions You were cared for by a hospitalist during your hospital stay. If you have any questions about your discharge medications or the care you received while you were in the hospital after you are discharged, you can call the unit and asked to speak with the hospitalist on call if the hospitalist that took care of you is not available. Once you are  discharged, your primary care physician will handle any further medical issues. Please note that NO REFILLS for any discharge medications will be authorized once you are discharged, as it is imperative that you return to your primary care physician (or establish a relationship with a primary care physician if you do not have one) for your aftercare needs  so that they can reassess your need for medications and monitor your lab values.  Discharge Instructions   Diet - low sodium heart healthy    Complete by:  As directed      Increase activity slowly    Complete by:  As directed             Medication List    STOP taking these medications       atorvastatin 10 MG tablet  Commonly known as:  LIPITOR     cholecalciferol 400 UNITS Tabs tablet  Commonly known as:  VITAMIN D     ferrous sulfate 325 (65 FE) MG tablet     Fish Oil 1000 MG Caps     galantamine 16 MG 24 hr capsule  Commonly known as:  RAZADYNE ER     metoprolol succinate 50 MG 24 hr tablet  Commonly known as:  TOPROL-XL     triamterene-hydrochlorothiazide 37.5-25 MG per tablet  Commonly known as:  MAXZIDE-25      TAKE these medications       chlorhexidine 0.12 % solution  Commonly known as:  PERIDEX  15 mLs by Mouth Rinse route 2 (two) times daily.     HYDROmorphone 1 MG/ML Soln injection  Commonly known as:  DILAUDID  Inject 1 mL (1 mg total) into the vein every 3 (three) hours as needed for severe pain.       No Known Allergies    The results of significant diagnostics from this hospitalization (including imaging, microbiology, ancillary and laboratory) are listed below for reference.    Significant Diagnostic Studies: Ct Abdomen Pelvis W Contrast  02/02/2014   CLINICAL DATA:  Emesis and tori stools  EXAM: CT ABDOMEN AND PELVIS WITH CONTRAST  TECHNIQUE: Multidetector CT imaging of the abdomen and pelvis was performed using the standard protocol following bolus administration of intravenous contrast.  CONTRAST:  74mL OMNIPAQUE IOHEXOL 300 MG/ML SOLN, 151mL OMNIPAQUE IOHEXOL 300 MG/ML SOLN  COMPARISON:  None.  FINDINGS: The lung bases are clear.  There is no intrahepatic or extrahepatic biliary ductal dilatation. The gallbladder is normal. The spleen demonstrates no focal abnormality.There is a horseshoe kidney. The adrenal glands and pancreas are normal.  The bladder is unremarkable.  There is irregular wall thickening involving the hepatic flexure measuring at least 10 cm in length most concerning for malignancy. There are multiple adjacent lymph nodes along the mesenteric aspect of the mass measuring up to 13 mm. The superior margin of the mass abuts the inferior margin of the liver with possible direct invasion. There are multiple hypodense hepatic masses with the largest measuring 16 mm along the inferior right hepatic lobe most concerning for metastatic disease. There is portacaval lymphadenopathy with the largest lymph node measuring 4.6 x 3.3 cm just anterior to the vena cava.  The hepatic flexure mass is resulting in colonic obstruction with severe dilatation of the ascending colon measuring up to 10 cm. There is also small bowel dilatation.  There is diverticulosis without evidence of diverticulitis. There is no pneumoperitoneum, pneumatosis, or portal venous gas. There is no abdominal or pelvic free fluid.  The abdominal aorta  is normal in caliber with atherosclerosis.  There are no lytic or sclerotic osseous lesions. There is lumbar spine spondylosis most severe at L2-3 and L5-S1.  IMPRESSION: 1. Irregular thickening involving the hepatic flexure measuring at least 10 cm in length most concerning for primary colonic malignancy. There are multiple adjacent lymph nodes along the mesenteric aspect of the mass measuring up to 13 mm concerning for local nodal metastasis. The superior margin of the mass abuts the inferior margin of the liver with irregularity at the interface concerning for direct hepatic invasion. There are multiple other hypodense hepatic masses most consistent with metastatic disease. 2. There is bowel obstruction resulting from the hepatic flexure colonic mass.   Electronically Signed   By: Kathreen Devoid   On: 02/02/2014 01:25   Dg Addison Bailey G Tube Plc W/fl W/rad  02/02/2014   CLINICAL DATA:  Needs nasogastric tube.  Dementia.  Colon mass.   EXAM: NASO G TUBE PLACEMENT WITH FL AND WITH RAD  : CONTRAST:  96mL OMNIPAQUE IOHEXOL 300 MG/ML  SOLN  FLUOROSCOPY TIME:  3 min in 18 seconds  COMPARISON:  CT of 02/02/2014  FINDINGS: Nasogastric tube displaced to the level of the gastric body. Location is verified using 20 cc of Omnipaque 300.  IMPRESSION: Successful placement of a nasogastric tube into the gastric body.   Electronically Signed   By: Abigail Miyamoto M.D.   On: 02/02/2014 15:22    Microbiology: No results found for this or any previous visit (from the past 240 hour(s)).   Labs: Basic Metabolic Panel:  Recent Labs Lab 02/01/14 2213 02/02/14 0543 02/03/14 0756 02/04/14 0503  NA 135* 135* 136* 140  K 4.3 4.3 3.9 3.9  CL 95* 97 102 107  CO2 23 24 20  17*  GLUCOSE 122* 116* 82 73  BUN 22 21 19 18   CREATININE 1.08 1.06 0.94 1.02  CALCIUM 9.6 9.0 8.4 8.2*   Liver Function Tests:  Recent Labs Lab 02/01/14 2213  AST 27  ALT 20  ALKPHOS 242*  BILITOT 0.4  PROT 7.2  ALBUMIN 2.4*    Recent Labs Lab 02/01/14 2213  LIPASE 15   No results found for this basename: AMMONIA,  in the last 168 hours CBC:  Recent Labs Lab 02/01/14 2213 02/02/14 0543  02/02/14 2330 02/03/14 0756 02/03/14 1549 02/03/14 2350 02/04/14 0503  WBC 20.1* 21.2*  --   --   --   --   --  15.3*  NEUTROABS 16.8*  --   --   --   --   --   --   --   HGB 11.6* 10.6*  < > 11.0* 10.8* 10.8* 10.2* 10.4*  HCT 35.5* 33.2*  < > 32.7* 32.5* 33.4* 31.3* 31.9*  MCV 85.7 86.7  --   --   --   --   --  86.4  PLT 503* 476*  --   --   --   --   --  430*  < > = values in this interval not displayed. Cardiac Enzymes: No results found for this basename: CKTOTAL, CKMB, CKMBINDEX, TROPONINI,  in the last 168 hours BNP: BNP (last 3 results) No results found for this basename: PROBNP,  in the last 8760 hours CBG: No results found for this basename: GLUCAP,  in the last 168 hours     Signed:  Kelvin Cellar  [facility] 02/05/2014, 4:52  PM

## 2014-02-05 NOTE — Progress Notes (Signed)
Patient XB:JYNWGN E Parkinson      DOB: 1923/08/24      FAO:[id number]  PICC deemed a helpful adjunct for comfort at hospice home.  Attempted to call spouse to ask about placing and give heads up about consent which would be needed.  Will place consult, but the challenge will be getting hold of wife for consent.  Kataleia Quaranta L. Lovena Le, MD MBA The Palliative Medicine Team at [facility] Phone: [phone] Pager: [phone] ( Use team phone after hours)

## 2014-02-05 NOTE — Care Management Note (Signed)
    Page 1 of 1   02/05/2014     3:01:59 PM CARE MANAGEMENT NOTE 02/05/2014  Patient:  ABHAY, GODBOLT   Account Number:  [id number]  Date Initiated:  02/05/2014  Documentation initiated by:  Dessa Phi  Subjective/Objective Assessment:   80 Y/O M ADMITTED W/BOWEL OBSTRUCTION.     Action/Plan:   FROM HOME.   Anticipated DC Date:  02/05/2014   Anticipated DC Plan:  [place]  CM consult      Choice offered to / List presented to:             Status of service:  In process, will continue to follow Medicare Important Message given?  YES (If response is "NO", the following Medicare IM given date fields will be blank) Date Medicare IM given:  02/05/2014 Medicare IM given by:  [facility] Date Additional Medicare IM given:   Additional Medicare IM given by:    Discharge Disposition:    Per UR Regulation:  Reviewed for med. necessity/level of care/duration of stay  If discussed at [place] of Stay Meetings, dates discussed:   02/05/2014    Comments:  02/05/14 Audel Coakley RN,BSN NCM [place].D/C PLAN [facility].

## 2014-02-05 NOTE — Progress Notes (Signed)
CSW continuing to follow for residential hospice placement.  CSW received notification from [facility], Erling Conte that pt has bed available at [facility] for tomorrow 02/06/2014. Per chart, pt to have PICC line placed today for comfort.   CSW contacted pt wife, Archie Patten via telephone. CSW discussed with pt wife that [facility] has bed available tomorrow. Pt wife expressed understanding and states that she and pt son plan to be at hospital later this afternoon.  CSW updated MD.  CSW to continue to follow and facilitate pt discharge needs when pt medically ready for discharge.  Nathan Glenn, MSW, LCSW Clinical Social Work Coverage for [employer], [place]

## 2014-02-05 NOTE — Progress Notes (Signed)
[facility] PROGRESS NOTE  Nathan Glenn SWH:[id number] DOB: [birth year] DOA: 02/01/2014 PCP: Nathan Ly, MD  Assessment/Plan: 1. Bowel Obstruction Secondary to Malignancy -Patient presenting with several episodes of nausea and vomiting. -CT scan of ABD/Pelvis showed irregular thickening involving the hepatic flexure measuring at least 10 cm in length most concerning for primary colonic malignancy.  -General Surgery Glenn.  -NT tube to be placed by IR -I spoke with his wife Nathan Glenn and son regarding goals of care and treatment options, as they seemed inclined to pursue comfort. Palliative Care Glenn for assistance with defining goals of care.   -Continue IV fluids/supportive care -No changes today  2. Intractable Nausea/Vomiting -Secondary to bowel obstruction from underlying malignancy -IR placed NG tube  3. Advanced Dementia -Stable  4. Goals of Care. Patient made a DNR after family discussion. Palliative care Glenn to discuss goals of care. Family to decide if they wish to pursue surgical intervention although based on our conversation it appears they were leaning towards comfort.       Code Status: Full Code Family Communication: I spoke with family today, they are anticipating transfer to [facility] in AM Disposition Plan: PICC line placement, d/c in AM  Consultants:  General surgery  HPI/Subjective: Nathan Glenn is a 80 y.o. male with a history of Dementia, HTN, Hyperlipidemia and remote history of Prostate Cancer, and skin Cancer who was brought to the emergency department at [facility]. Family calling EMS after patient had 4 episodes of nonbloody emesis. Family also reported black tarry stools. A CT scan of ABD/Pelvis  showed irregular thickening involving the hepatic flexure measuring at least 10 cm in length most concerning for primary colonic malignancy. There are multiple adjacent lymph nodes along the mesenteric aspect of the mass  measuring up to 13 mm concerning for local nodal metastasis. The superior margin of the mass abuts the inferior margin of the liver with irregularity at the interface concerning for direct hepatic invasion. There are multiple other hypodense hepatic masses most consistent with metastatic disease. Nathan Glenn.   Patient's exam essentially unchanged. He is pleasantly confused and disoriented   Objective: Filed Vitals:   02/05/14 1304  BP: 133/62  Pulse: 75  Temp: 98.5 F (36.9 C)  Resp: 18    Intake/Output Summary (Last 24 hours) at 02/05/14 1702 Last data filed at 02/05/14 1545  Gross per 24 hour  Intake 4288.75 ml  Output   2250 ml  Net 2038.75 ml   Filed Weights   02/02/14 1000  Weight: 79 kg (174 lb 2.6 oz)    Exam:   General:  Elderly male, demented, no distress  Cardiovascular: Regular rate and rhythm normal S1S2, no edema  Respiratory: Clear to auscultation  Abdomen: Improved abdominal exam. There was no rebound tenderness of peritoneal signs.   Musculoskeletal: no edema  Data Reviewed: Basic Metabolic Panel:  Recent Labs Lab 02/01/14 2213 02/02/14 0543 02/03/14 0756 02/04/14 0503  NA 135* 135* 136* 140  K 4.3 4.3 3.9 3.9  CL 95* 97 102 107  CO2 23 24 20  17*  GLUCOSE 122* 116* 82 73  BUN 22 21 19 18   CREATININE 1.08 1.06 0.94 1.02  CALCIUM 9.6 9.0 8.4 8.2*   Liver Function Tests:  Recent Labs Lab 02/01/14 2213  AST 27  ALT 20  ALKPHOS 242*  BILITOT 0.4  PROT 7.2  ALBUMIN 2.4*    Recent Labs Lab 02/01/14 2213  LIPASE 15   No  results found for this basename: AMMONIA,  in the last 168 hours CBC:  Recent Labs Lab 02/01/14 2213 02/02/14 0543  02/02/14 2330 02/03/14 0756 02/03/14 1549 02/03/14 2350 02/04/14 0503  WBC 20.1* 21.2*  --   --   --   --   --  15.3*  NEUTROABS 16.8*  --   --   --   --   --   --   --   HGB 11.6* 10.6*  < > 11.0* 10.8* 10.8* 10.2* 10.4*  HCT 35.5* 33.2*  < > 32.7* 32.5* 33.4*  31.3* 31.9*  MCV 85.7 86.7  --   --   --   --   --  86.4  PLT 503* 476*  --   --   --   --   --  430*  < > = values in this interval not displayed. Cardiac Enzymes: No results found for this basename: CKTOTAL, CKMB, CKMBINDEX, TROPONINI,  in the last 168 hours BNP (last 3 results) No results found for this basename: PROBNP,  in the last 8760 hours CBG: No results found for this basename: GLUCAP,  in the last 168 hours  No results found for this or any previous visit (from the past 240 hour(s)).   Studies: No results found.  Scheduled Meds: . antiseptic oral rinse  15 mL Mouth Rinse q12n4p  . chlorhexidine  15 mL Mouth Rinse BID  . pantoprazole (PROTONIX) IV  40 mg Intravenous Q12H   Continuous Infusions: . sodium chloride 125 mL/hr at 02/05/14 1304    Principal Problem:   Bowel obstruction Active Problems:   Liver masses   Nausea and vomiting   Heme positive stool   Anemia   Leukocytosis   Thrombocytosis   Benign essential hypertension   Hyperlipidemia   Dementia   Malignant neoplasm of ascending colon    Time spent: 25 min    Nathan Glenn  [facility] Pager [phone]. If 7PM-7AM, please contact night-coverage at [email or web address], password [facility] 02/05/2014, 5:02 PM  LOS: 4 days

## 2014-02-06 MED ORDER — SODIUM CHLORIDE 0.9 % IJ SOLN: 10.0000 mL | INTRAMUSCULAR | Status: DC | PRN

## 2014-02-06 NOTE — Consult Note (Signed)
[facility] Liaison: [employer] available for Mr. Finlay today. Spouse and son completed admission paper work yesterday in anticipation of transfer today. Please fax discharge summary to [phone] and RN please call report to [phone]. Please arrange transport for patient to arrive before noon. Understand from unit staff PICC line being placed at this time. Appreciate Dr. Tanna Furry help with this transfer. Thank you. Erling Conte LCSW [phone]

## 2014-02-06 NOTE — Progress Notes (Signed)
Nutrition Brief Note  Chart reviewed. Pt now transitioning to comfort care.  No further nutrition interventions warranted at this time.  Please re-consult as needed.   Carlis Stable MS, [place], LDN [phone] Pager [phone] Weekend/After Hours Pager

## 2014-02-06 NOTE — Progress Notes (Signed)
02/06/14 0945  Called report to nurse at [facility]. Pt ready for transport.

## 2014-02-06 NOTE — Progress Notes (Signed)
02/06/14 EMS here to transport patient to [facility]

## 2014-02-16 DEATH — deceased

## 2014-11-21 IMAGING — RF DG NASO G TUBE PLC W/FL W/RAD
1 series · 1 of 1 positions shown · IV contrast (omnipaque)
Comparison: CT of 02/02/2014

CLINICAL DATA: Needs nasogastric tube.  Dementia.  Colon mass.

EXAM:
NASO G TUBE PLACEMENT WITH FL AND WITH RAD
:
CONTRAST:
20mL OMNIPAQUE IOHEXOL 300 MG/ML  SOLN
FLUOROSCOPY TIME:  3 min in 18 seconds

[Series 1: run · 1 of 1 slices shown]
[im 1/1]
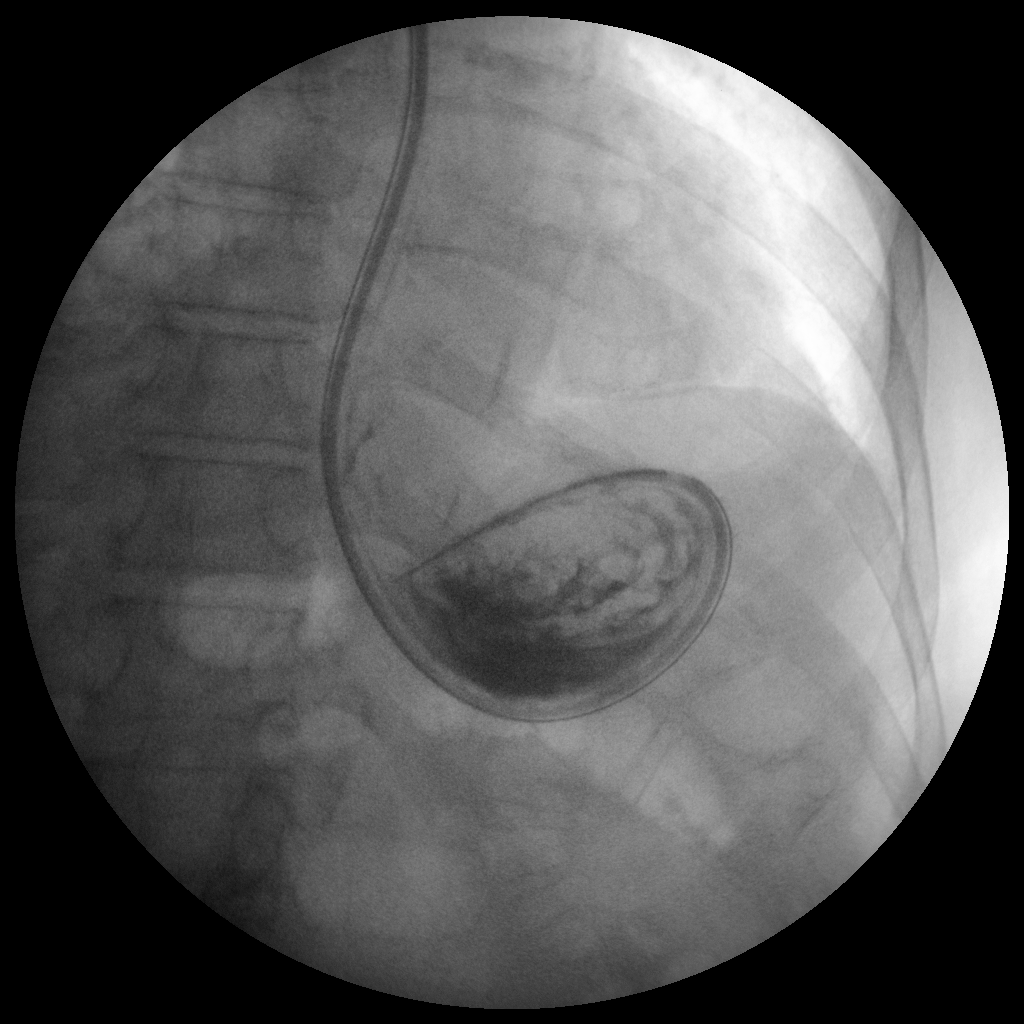

[1 of 1 positions shown; findings below may reference images not displayed]

FINDINGS: Nasogastric tube displaced to the level of the gastric body.
Location is verified using 20 cc of Omnipaque 300.
IMPRESSION: Successful placement of a nasogastric tube into the gastric body.
